# Patient Record
Sex: Female | Born: 1965 | Race: White | Hispanic: Yes | Marital: Married | Smoking: Never smoker
Health system: Southern US, Community
[De-identification: ages and names within clinical notes are randomized; demographics above are authoritative.]

---

## 2018-04-14 ENCOUNTER — Emergency Department (HOSPITAL_BASED_OUTPATIENT_CLINIC_OR_DEPARTMENT_OTHER): Payer: BLUE CROSS/BLUE SHIELD

## 2018-04-14 ENCOUNTER — Emergency Department (HOSPITAL_BASED_OUTPATIENT_CLINIC_OR_DEPARTMENT_OTHER)
Admission: EM | Admit: 2018-04-14 | Discharge: 2018-04-14 | Disposition: A | Discharge status: Home / Self Care | Payer: BLUE CROSS/BLUE SHIELD | Attending: Emergency Medicine | Admitting: Emergency Medicine

## 2018-04-14 ENCOUNTER — Emergency Department (HOSPITAL_COMMUNITY): Payer: BLUE CROSS/BLUE SHIELD

## 2018-04-14 ENCOUNTER — Other Ambulatory Visit: Payer: Self-pay

## 2018-04-14 ENCOUNTER — Encounter (HOSPITAL_BASED_OUTPATIENT_CLINIC_OR_DEPARTMENT_OTHER): Payer: Self-pay

## 2018-04-14 DIAGNOSIS — I10 Essential (primary) hypertension: Secondary | ICD-10-CM | POA: Diagnosis not present

## 2018-04-14 DIAGNOSIS — R200 Anesthesia of skin: Secondary | ICD-10-CM | POA: Insufficient documentation

## 2018-04-14 LAB — COMPREHENSIVE METABOLIC PANEL
ALK PHOS: 72 U/L (ref 38–126)
ALT: 27 U/L (ref 14–54)
AST: 28 U/L (ref 15–41)
Albumin: 4.3 g/dL (ref 3.5–5.0)
Anion gap: 8 (ref 5–15)
BUN: 14 mg/dL (ref 6–20)
CHLORIDE: 105 mmol/L (ref 101–111)
CO2: 23 mmol/L (ref 22–32)
CREATININE: 0.55 mg/dL (ref 0.44–1.00)
Calcium: 8.7 mg/dL — ABNORMAL LOW (ref 8.9–10.3)
Glucose, Bld: 110 mg/dL — ABNORMAL HIGH (ref 65–99)
Potassium: 3.1 mmol/L — ABNORMAL LOW (ref 3.5–5.1)
SODIUM: 136 mmol/L (ref 135–145)
Total Bilirubin: 0.3 mg/dL (ref 0.3–1.2)
Total Protein: 8.1 g/dL (ref 6.5–8.1)

## 2018-04-14 LAB — RAPID URINE DRUG SCREEN, HOSP PERFORMED
Amphetamines: NOT DETECTED
Barbiturates: NOT DETECTED
Benzodiazepines: NOT DETECTED
Cocaine: NOT DETECTED
Opiates: NOT DETECTED
Tetrahydrocannabinol: NOT DETECTED

## 2018-04-14 LAB — PREGNANCY, URINE: Preg Test, Ur: NEGATIVE

## 2018-04-14 LAB — CBC
HEMATOCRIT: 38.3 % (ref 36.0–46.0)
HEMOGLOBIN: 13.1 g/dL (ref 12.0–15.0)
MCH: 25.7 pg — ABNORMAL LOW (ref 26.0–34.0)
MCHC: 34.2 g/dL (ref 30.0–36.0)
MCV: 75.2 fL — AB (ref 78.0–100.0)
Platelets: 215 10*3/uL (ref 150–400)
RBC: 5.09 MIL/uL (ref 3.87–5.11)
RDW: 19.5 % — ABNORMAL HIGH (ref 11.5–15.5)
WBC: 6.9 10*3/uL (ref 4.0–10.5)

## 2018-04-14 LAB — PROTIME-INR
INR: 0.9
Prothrombin Time: 12 s (ref 11.4–15.2)

## 2018-04-14 LAB — CBG MONITORING, ED: Glucose-Capillary: 115 mg/dL — ABNORMAL HIGH (ref 65–99)

## 2018-04-14 LAB — TROPONIN I: Troponin I: <0.03 ng/mL

## 2018-04-14 LAB — DIFFERENTIAL
Basophils Absolute: 0 K/uL (ref 0.0–0.1)
Basophils Relative: 0 %
Eosinophils Absolute: 0.3 K/uL (ref 0.0–0.7)
Eosinophils Relative: 4 %
Lymphocytes Relative: 29 %
Lymphs Abs: 2 K/uL (ref 0.7–4.0)
Monocytes Absolute: 0.6 K/uL (ref 0.1–1.0)
Monocytes Relative: 8 %
Neutro Abs: 4 K/uL (ref 1.7–7.7)
Neutrophils Relative %: 59 %

## 2018-04-14 LAB — URINALYSIS, ROUTINE W REFLEX MICROSCOPIC
Bilirubin Urine: NEGATIVE
Glucose, UA: NEGATIVE mg/dL
Ketones, ur: NEGATIVE mg/dL
Leukocytes, UA: NEGATIVE
Nitrite: NEGATIVE
Protein, ur: NEGATIVE mg/dL
Specific Gravity, Urine: 1.015 (ref 1.005–1.030)
pH: 7.5 (ref 5.0–8.0)

## 2018-04-14 LAB — URINALYSIS, MICROSCOPIC (REFLEX)

## 2018-04-14 LAB — APTT: aPTT: 28 s (ref 24–36)

## 2018-04-14 LAB — ETHANOL: Alcohol, Ethyl (B): <10 mg/dL (ref <10)

## 2018-04-14 MED ORDER — OXYCODONE HCL 5 MG PO TABS: 5.0000 mg | ORAL_TABLET | Freq: Once | ORAL | Status: DC

## 2018-04-14 MED ORDER — ACETAMINOPHEN 500 MG PO TABS
1000.0000 mg | ORAL_TABLET | Freq: Once | ORAL | Status: AC
  Administered 2018-04-14: 1000 mg via ORAL
  Filled 2018-04-14: qty 2

## 2018-04-14 NOTE — ED Notes (Signed)
Pt verbalizes understanding of d/c instructions. Pt ambulatory at d/c with all belongings.   

## 2018-04-14 NOTE — Discharge Instructions (Addendum)
Your MRI today did not show evidence of stroke, tumor, or bleed.  The MRI was reassuring.  Based on the discussion with the previous team, the neurology team requested you follow-up as an outpatient with neurology and return to the emergency department if any symptoms change or worsen, as her symptoms have resolved, we feel you are safe for discharge home.  Please follow-up as directed.

## 2018-04-14 NOTE — ED Notes (Signed)
Pt reports pain in right arm around elbow x 3 days. Yesterday her right hand went numb and she states it is still numb. Today she had numbness in her chin and around her mouth which has resolved. She denies Hx of HTN but states her bp was high last year when she was [facility]. She is not on any medications. Speech is clear, facial symmetry present, cao x 4.

## 2018-04-14 NOTE — ED Provider Notes (Signed)
[facility] EMERGENCY DEPARTMENT Provider Note   CSN: [id number] Arrival date & time: 04/14/18  1625     History   Chief Complaint Chief Complaint  Patient presents with  . Numbness    HPI Maureen Drake is a 52 y.o. female.  52 yo F with a cc of R hand numbness.  Going on for past 28 hours or so.  This happened about noon time yesterday.  The patient felt that her right hand was heavy.  She was having trouble describing it other than that.  She does feel that it hurts her as well.  She also feels the same sensation to bilateral aspects along the lower jawline.  She denies headache or neck pain.  Denies trouble swallowing or with speech.  Denies weakness.  Has been ambulating without difficulty.  Denies trauma.  Denies issue like this before.  The history is provided by the patient and the spouse.  Illness  This is a new problem. The current episode started yesterday. The problem occurs constantly. The problem has not changed since onset.Pertinent negatives include no chest pain, no headaches and no shortness of breath. Nothing aggravates the symptoms. Nothing relieves the symptoms. She has tried nothing for the symptoms. The treatment provided no relief.    History reviewed. No pertinent past medical history.  There are no active problems to display for this patient.   History reviewed. No pertinent surgical history.   OB History   None      Home Medications    Prior to Admission medications   Not on File    Family History History reviewed. No pertinent family history.  Social History Social History   Tobacco Use  . Smoking status: Never Smoker  . Smokeless tobacco: Never Used  Substance Use Topics  . Alcohol use: Never    Frequency: Never  . Drug use: Never     Allergies   Patient has no allergy information on record.   Review of Systems Review of Systems  Constitutional: Negative for chills and fever.  HENT: Negative for congestion and  rhinorrhea.   Eyes: Negative for redness and visual disturbance.  Respiratory: Negative for shortness of breath and wheezing.   Cardiovascular: Negative for chest pain and palpitations.  Gastrointestinal: Negative for nausea and vomiting.  Genitourinary: Negative for dysuria and urgency.  Musculoskeletal: Negative for arthralgias and myalgias.  Skin: Negative for pallor and wound.  Neurological: Positive for numbness. Negative for dizziness and headaches.     Physical Exam Updated Vital Signs BP (!) 186/102   Pulse 70   Temp 98.7 F (37.1 C)   Resp 19   Ht 5\' 6"  (1.676 m)   Wt 77.1 kg (170 lb)   SpO2 96%   BMI 27.44 kg/m   Physical Exam  Constitutional: She is oriented to person, place, and time. She appears well-developed and well-nourished. No distress.  HENT:  Head: Normocephalic and atraumatic.  Eyes: Pupils are equal, round, and reactive to light. EOM are normal.  Neck: Normal range of motion. Neck supple.  Cardiovascular: Normal rate and regular rhythm. Exam reveals no gallop and no friction rub.  No murmur heard. Pulmonary/Chest: Effort normal. She has no wheezes. She has no rales.  Abdominal: Soft. She exhibits no distension and no mass. There is no tenderness. There is no guarding.  Musculoskeletal: She exhibits no edema or tenderness.  No weakness or numbness to the right hand.  Benign neuro exam.  Neurological: She is alert and oriented  to person, place, and time. She has normal strength. No cranial nerve deficit or sensory deficit. She displays a negative Romberg sign. Coordination and gait normal. GCS eye subscore is 4. GCS verbal subscore is 5. GCS motor subscore is 6.  Ambulates without difficulty.  Negative Spurling's  Skin: Skin is warm and dry. She is not diaphoretic.  Psychiatric: She has a normal mood and affect. Her behavior is normal.  Nursing note and vitals reviewed.    ED Treatments / Results  Labs (all labs ordered are listed, but only abnormal  results are displayed) Labs Reviewed  CBC - Abnormal; Notable for the following components:      Result Value   MCV 75.2 (*)    MCH 25.7 (*)    RDW 19.5 (*)    All other components within normal limits  URINALYSIS, ROUTINE W REFLEX MICROSCOPIC - Abnormal; Notable for the following components:   Hgb urine dipstick TRACE (*)    All other components within normal limits  URINALYSIS, MICROSCOPIC (REFLEX) - Abnormal; Notable for the following components:   Bacteria, UA RARE (*)    Squamous Epithelial / LPF 0-5 (*)    All other components within normal limits  CBG MONITORING, ED - Abnormal; Notable for the following components:   Glucose-Capillary 115 (*)    All other components within normal limits  PROTIME-INR  APTT  DIFFERENTIAL  RAPID URINE DRUG SCREEN, HOSP PERFORMED  PREGNANCY, URINE  ETHANOL  COMPREHENSIVE METABOLIC PANEL  TROPONIN I    EKG EKG Interpretation  Date/Time:  Sunday April 14 2018 16:34:47 EDT Ventricular Rate:  77 PR Interval:  156 QRS Duration: 100 QT Interval:  412 QTC Calculation: 466 R Axis:   15 Text Interpretation:  Normal sinus rhythm Possible Left atrial enlargement Incomplete right bundle branch block Borderline ECG No old tracing to compare Confirmed by Tameka Hoiland ([id number]) on 04/14/2018 4:54:58 PM   Radiology Ct Head Wo Contrast  Result Date: 04/14/2018 CLINICAL DATA:  Right arm pain, right arm numbness. EXAM: CT HEAD WITHOUT CONTRAST TECHNIQUE: Contiguous axial images were obtained from the base of the skull through the vertex without intravenous contrast. COMPARISON:  None. FINDINGS: Brain: No acute intracranial abnormality. Specifically, no hemorrhage, hydrocephalus, mass lesion, acute infarction, or significant intracranial injury. Vascular: No hyperdense vessel or unexpected calcification. Skull: No acute calvarial abnormality. Sinuses/Orbits: Visualized paranasal sinuses and mastoids clear. Orbital soft tissues unremarkable. Other: None  IMPRESSION: Normal study. Electronically Signed   By: Kevin  Dover M.D.   On: 04/14/2018 17:32    Procedures Procedures (including critical care time)  Medications Ordered in ED Medications  acetaminophen (TYLENOL) tablet 1,000 mg (has no administration in time range)  oxyCODONE (Oxy IR/ROXICODONE) immediate release tablet 5 mg (has no administration in time range)     Initial Impression / Assessment and Plan / ED Course  I have reviewed the triage vital signs and the nursing notes.  Pertinent labs & imaging results that were available during my care of the patient were reviewed by me and considered in my medical decision making (see chart for details).     52  yo F with a chief complaint of right hand numbness.  This is a bit atypical from a normal strokelike scenario.  She is markedly hypertensive though has not seen a doctor as an outpatient in her life.  Neuro exam is benign.  I discussed the case with the neurologist on-call, Dr. Jerrell Belfast he recommended an MRI without contrast.  If this is normal  then he recommends an outpatient neurology follow-up.  I discussed the case with Dr. Rush Landmarkegeler who accepted the patient in transfer.  The patients results and plan were reviewed and discussed.   Any x-rays performed were independently reviewed by myself.   Differential diagnosis were considered with the presenting HPI.  Medications  acetaminophen (TYLENOL) tablet 1,000 mg (has no administration in time range)  oxyCODONE (Oxy IR/ROXICODONE) immediate release tablet 5 mg (has no administration in time range)    Vitals:   04/14/18 1639 04/14/18 1640 04/14/18 1739 04/14/18 1756  BP: (!) 207/96  (!) 186/102   Pulse:  71 70   Resp:  14 19   Temp:    98.7 F (37.1 C)  TempSrc:      SpO2: 99% 99% 96%   Weight:      Height:        Final diagnoses:  Numbness      Final Clinical Impressions(s) / ED Diagnoses   Final diagnoses:  Numbness    ED Discharge Orders        Ordered     Ambulatory referral to Neurology    Comments:  Hand numbness   04/14/18 1801       Melene PlanFloyd, Derren Suydam, DO 04/14/18 1805

## 2018-04-14 NOTE — ED Provider Notes (Signed)
Patient transferred [facility] from Dr. Adela LankFloyd.  Next  [facility] was [facility] order to get an MRI to rule out stroke as the imaging was not available at that facility.  According to Dr. Lanetta InchFloyd's report, patient had right arm numbness as well as some chin numbness.  Patient will get MRIs.  If MRIs show a normality's, anticipate patient will require neurology consultation and evaluation.  If MRI is negative, anticipate patient will be stable for discharge home.  10:26 PM MRI was negative.   Patient was reassessed and her symptoms  continued to have resolved.  Patient will follow-up with outpatient neurology and observe strict return precautions.  Patient and family had no other questions or concerns and patient was discharged in good condition after reassuring MRI.     Clinical Impression: 1. Numbness     Disposition: Discharge  Condition: Good  I have discussed the results, Dx and Tx plan with the pt(& family if present). He/she/they expressed understanding and agree(s) with the plan. Discharge instructions discussed at great length. Strict return precautions discussed and pt &/or family have verbalized understanding of the instructions. No further questions at time of discharge.    New Prescriptions   No medications on file    [facility] Up: [facility] [address] [place] [phone] Go to    [facility] [address]     [address] [place] [phone]    [facility] [address] [id number] [place]c [place] [id number] [phone]        Tegeler, Canary Brimhristopher J, MD 04/14/18 2226

## 2018-04-14 NOTE — ED Triage Notes (Signed)
Patient began to have right arm/hand numbness yesterday and has continued to occur intermittently. Today she also began to have numbness in her chin/mouth/jawline. Denies pain or weakness currently.

## 2018-04-14 NOTE — ED Triage Notes (Signed)
Pt presents with numbness to right arm onset yesterday. Now presents with numbness to chin that started 1530.

## 2018-04-14 NOTE — ED Notes (Signed)
Patient transported to MRI 

## 2018-04-14 NOTE — ED Notes (Signed)
Patient transported to CT 

## 2018-04-14 NOTE — ED Notes (Signed)
Alert, NAD, calm, interactive, resps e/u, speaking in clear complete sentences, no dyspnea noted, skin W&D, VSS, BP remains elevated, "feel better", reports some improved yet residual numbness in R 4-5th finger tips, (denies: pain, sob, nausea, dizziness or visual changes). [facility] Pending [facility] arrival, ~15 minutes.

## 2019-06-23 IMAGING — CT CT HEAD W/O CM
3 series · 16 of 47 positions shown, 19 images · non-contrast
Comparison: None.

CLINICAL DATA: Right arm pain, right arm numbness.

EXAM:
CT HEAD WITHOUT CONTRAST
TECHNIQUE: Contiguous axial images were obtained from the base of the skull
through the vertex without intravenous contrast.

[Series 2: head wo · axial · 0.45mm/px · z∈[+940,+1070]mm · 10 of 32 slices shown, 13 images]
[im 3/32  brain]
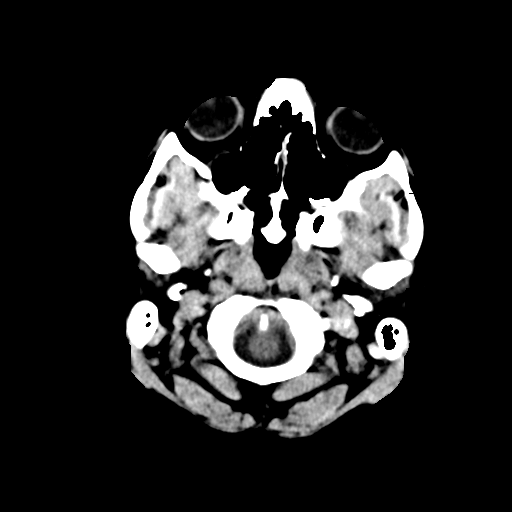
[im 3/32  bone]
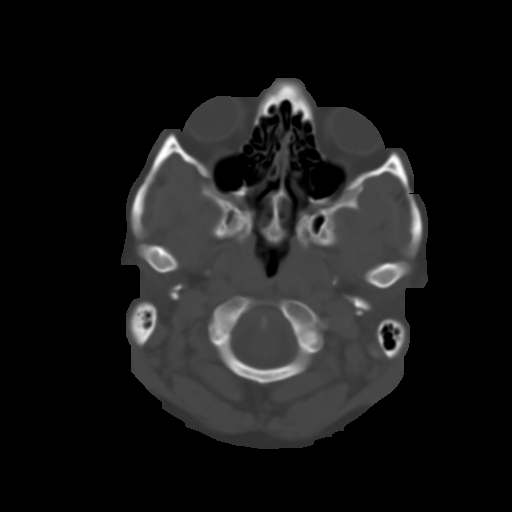
[im 6/32  brain]
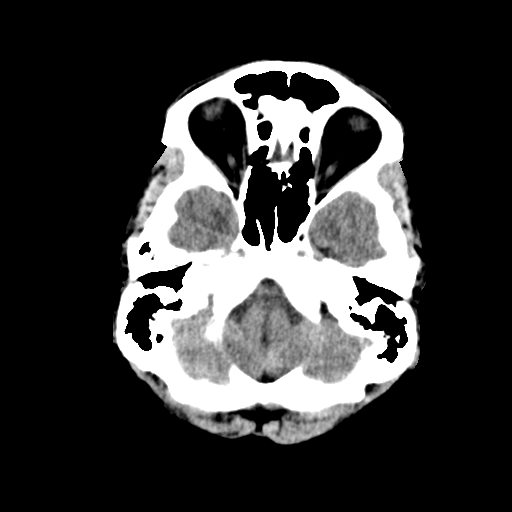
[im 9/32  brain]
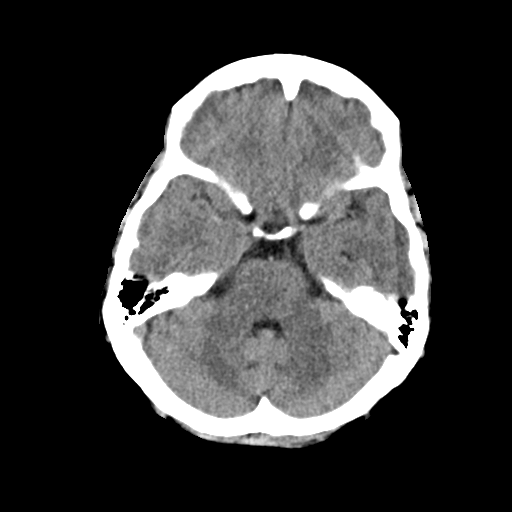
[im 11/32  brain]
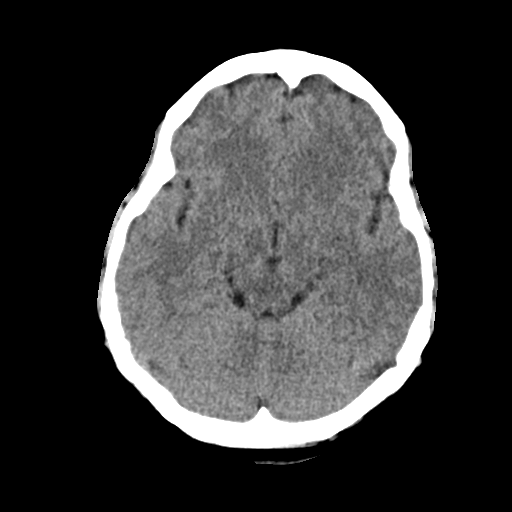
[im 14/32  brain]
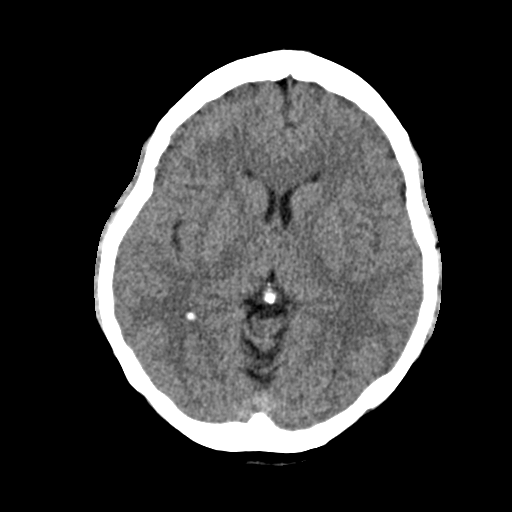
[im 14/32  bone]
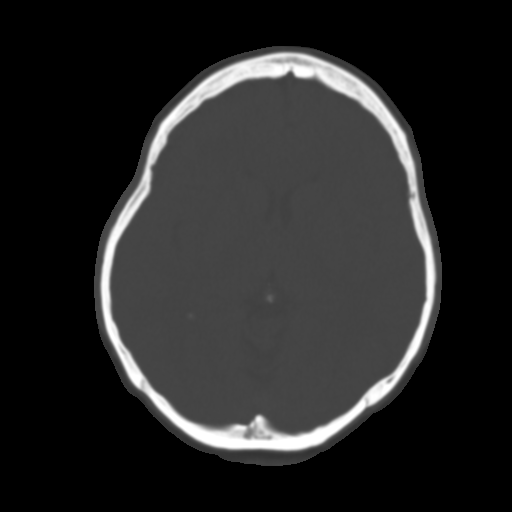
[im 18/32  brain]
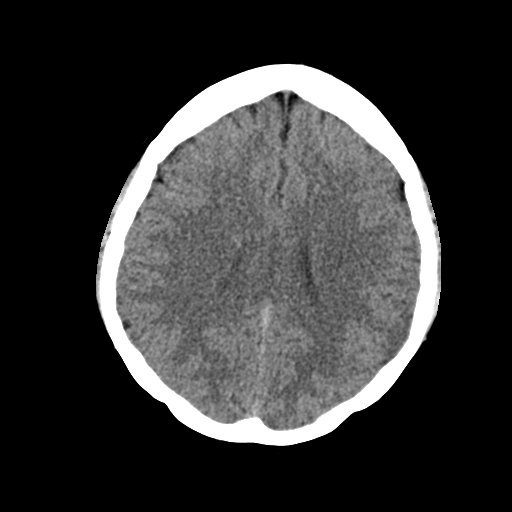
[im 21/32  brain]
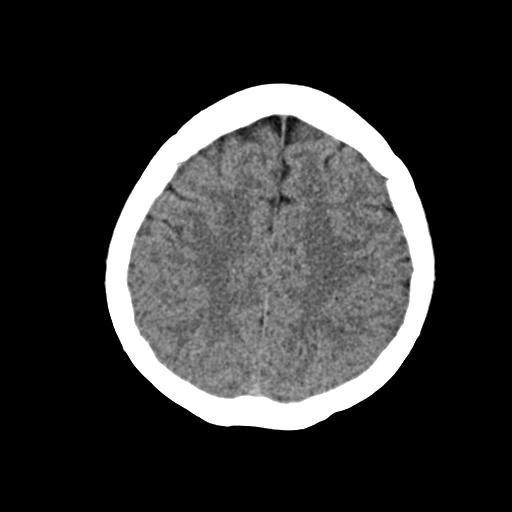
[im 24/32  brain]
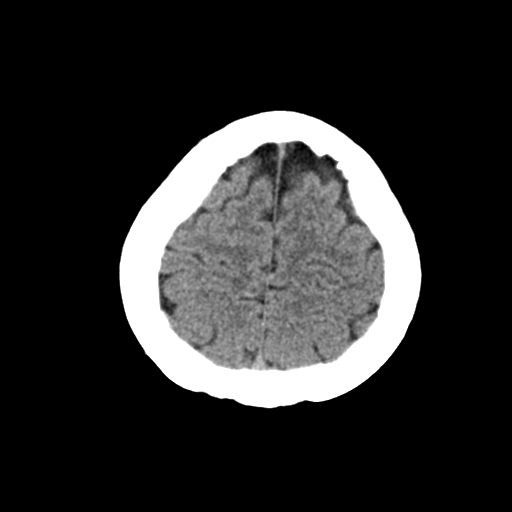
[im 26/32  brain]
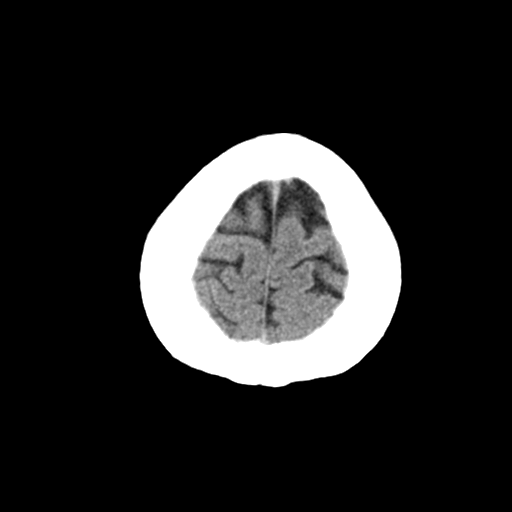
[im 26/32  bone]
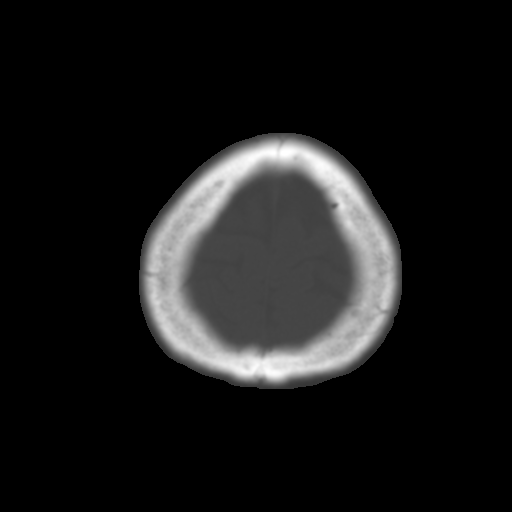
[im 29/32  brain]
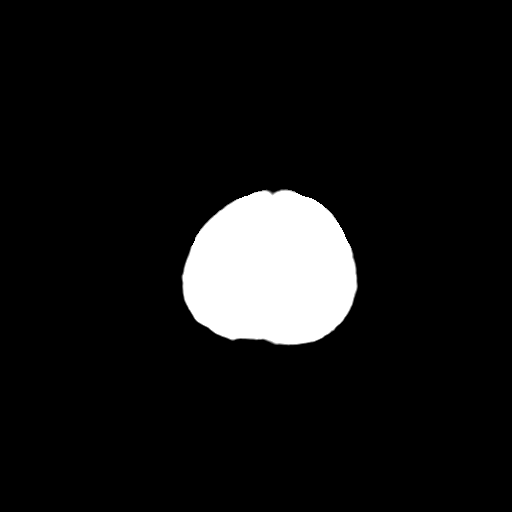

[Series 4: coronal soft · coronal · 0.32mm/px · 3 of 66 slices shown]
[im 22/66  brain]
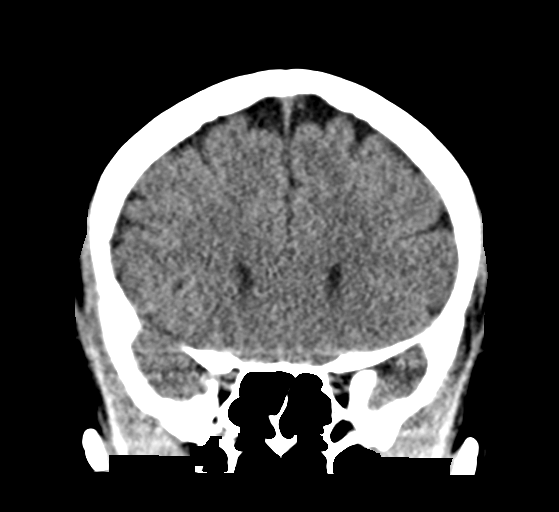
[im 29/66  brain]
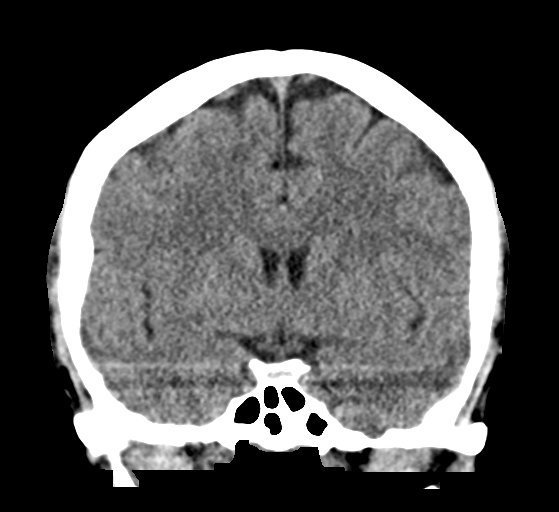
[im 37/66  brain]
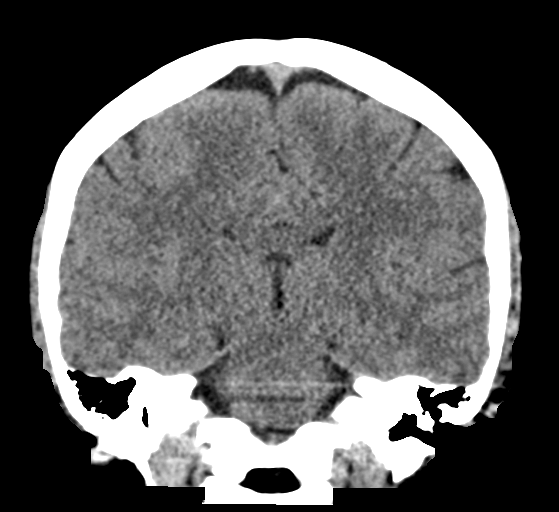

[Series 5: sag soft · sagittal · 0.34mm/px · 3 of 56 slices shown]
[im 19/56  brain]
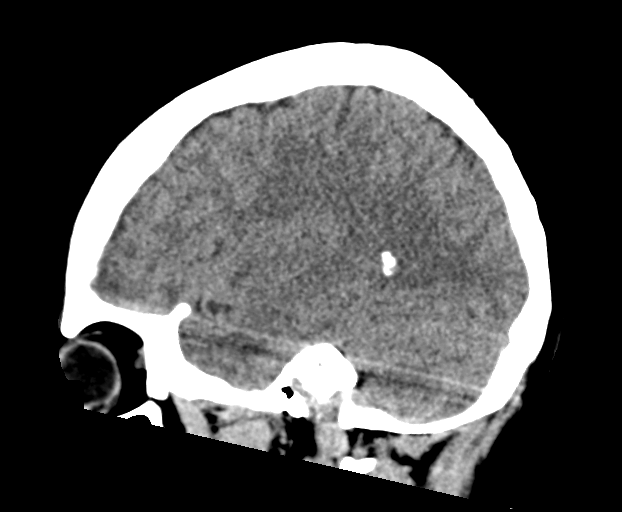
[im 28/56  brain]
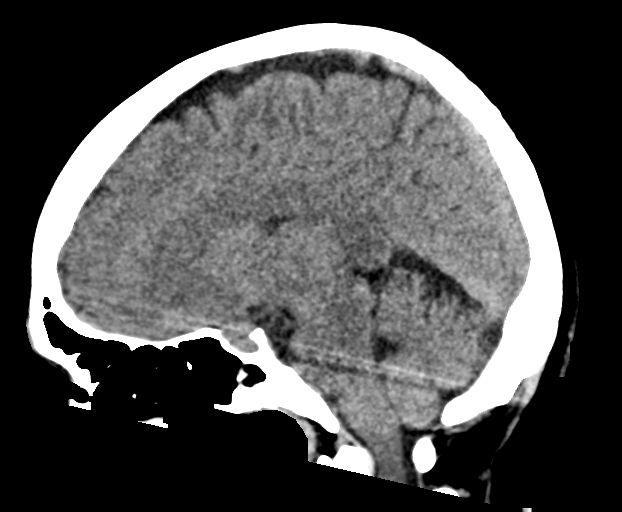
[im 37/56  brain]
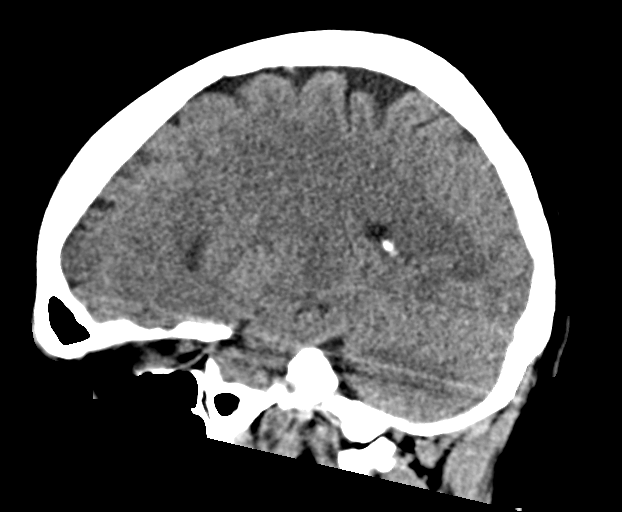

[16 of 47 positions shown; findings below may reference images not displayed]

FINDINGS: Brain: No acute intracranial abnormality. Specifically, no
hemorrhage, hydrocephalus, mass lesion, acute infarction, or
significant intracranial injury.

Vascular: No hyperdense vessel or unexpected calcification.

Skull: No acute calvarial abnormality.

Sinuses/Orbits: Visualized paranasal sinuses and mastoids clear.
Orbital soft tissues unremarkable.

Other: None
IMPRESSION: Normal study.

## 2021-01-13 ENCOUNTER — Other Ambulatory Visit: Payer: Self-pay

## 2021-01-13 DIAGNOSIS — Z20822 Contact with and (suspected) exposure to covid-19: Secondary | ICD-10-CM

## 2021-01-14 LAB — SARS-COV-2, NAA 2 DAY TAT

## 2021-01-14 LAB — NOVEL CORONAVIRUS, NAA: SARS-CoV-2, NAA: NOT DETECTED

## 2021-12-23 ENCOUNTER — Ambulatory Visit (INDEPENDENT_AMBULATORY_CARE_PROVIDER_SITE_OTHER): Payer: Self-pay | Admitting: Nurse Practitioner

## 2021-12-23 ENCOUNTER — Other Ambulatory Visit: Payer: Self-pay

## 2021-12-23 ENCOUNTER — Encounter (HOSPITAL_BASED_OUTPATIENT_CLINIC_OR_DEPARTMENT_OTHER): Payer: Self-pay | Admitting: Nurse Practitioner

## 2021-12-23 VITALS — BP 117/72 | HR 63 | Ht 65.0 in | Wt 174.4 lb

## 2021-12-23 DIAGNOSIS — G8929 Other chronic pain: Secondary | ICD-10-CM

## 2021-12-23 DIAGNOSIS — N909 Noninflammatory disorder of vulva and perineum, unspecified: Secondary | ICD-10-CM | POA: Insufficient documentation

## 2021-12-23 DIAGNOSIS — M25572 Pain in left ankle and joints of left foot: Secondary | ICD-10-CM

## 2021-12-23 DIAGNOSIS — L292 Pruritus vulvae: Secondary | ICD-10-CM

## 2021-12-23 DIAGNOSIS — Z23 Encounter for immunization: Secondary | ICD-10-CM

## 2021-12-23 HISTORY — DX: Noninflammatory disorder of vulva and perineum, unspecified: N90.9

## 2021-12-23 HISTORY — DX: Other chronic pain: G89.29

## 2021-12-23 HISTORY — DX: Pruritus vulvae: L29.2

## 2021-12-23 MED ORDER — BETAMETHASONE DIPROPIONATE 0.05 % EX OINT: TOPICAL_OINTMENT | Freq: Two times a day (BID) | CUTANEOUS | 1 refills | Status: DC

## 2021-12-23 NOTE — Patient Instructions (Addendum)
Thank you for choosing [facility] for your Primary Care needs. I am excited for the opportunity to partner with you to meet your health care goals. It was a pleasure meeting you today!  Recommendations from today's visit: I would like to you to let me know if the redness and swelling come back on your vagina. It would be helpful if we can look at it while it is active to best manage this. I have sent in a cream for you to use for itching. You can put a thin layer on the area of itching twice a day to see if this helps.  I feel the pain in your ankle is likely related to arthritis. This is best managed with tylenol and stretches every day. I would like to get an x-ray of this to make sure that there is nothing else going on.  I want to have you come back so we can do your full physical and get labs, but I want to wait until we get the paperwork completed for financial assistance with the visits. Until then, you let me know if anything comes up.   Information on diet, exercise, and health maintenance recommendations are listed below. This is information to help you be sure you are on track for optimal health and monitoring.   Please look over this and let us know if you have any questions or if you have completed any of the health maintenance outside of [place] so that we can be sure your records are up to date.  ___________________________________________________________ About Me: I am an Adult-Geriatric Nurse Practitioner with a background in caring for patients for more than 20 years with a strong intensive care background. I provide primary care and sports medicine services to patients age 62 and older within this office. My education had a strong focus on caring for the older adult population, which I am passionate about. I am also the director of the APP Fellowship with [facility].   My desire is to provide you with the best service through preventive  medicine and supportive care. I consider you a part of the medical team and value your input. I work diligently to ensure that you are heard and your needs are met in a safe and effective manner. I want you to feel comfortable with me as your provider and want you to know that your health concerns are important to me.  For your information, our office hours are: Monday, Tuesday, and Thursday 8:00 AM - 5:00 PM Wednesday and Friday 8:00 AM - 12:00 PM.   In my time away from the office I am teaching new APP's within the system and am unavailable, but my partner, Dr. Burnard Bunting is in the office for emergent needs.   If you have questions or concerns, please call our office at [phone] or send [place] a MyChart message and we will respond as quickly as possible.  ____________________________________________________________ MyChart:  For all urgent or time sensitive needs we ask that you please call the office to avoid delays. Our number is [phone]. MyChart is not constantly monitored and due to the large volume of messages a day, replies may take up to 72 business hours.  MyChart Policy: MyChart allows for you to see your visit notes, after visit summary, provider recommendations, lab and tests results, make an appointment, request refills, and contact your provider or the office for non-urgent questions or concerns. Providers are seeing patients during normal business  hours and do not have built in time to review MyChart messages.  We ask that you allow a minimum of 3 business days for responses to [employer]. For this reason, please do not send urgent requests through [place]. Please call the office at [phone]. New and ongoing conditions may require a visit. We have virtual and in person visit available for your convenience.  Complex MyChart concerns may require a visit. Your provider may request you schedule a virtual or in person visit to ensure we are providing the best care  possible. MyChart messages sent after 11:00 AM on Friday will not be received by the provider until Monday morning.    Lab and Test Results: You will receive your lab and test results on MyChart as soon as they are completed and results have been sent by the lab or testing facility. Due to this service, you will receive your results BEFORE your provider.  I review lab and tests results each morning prior to seeing patients. Some results require collaboration with other providers to ensure you are receiving the most appropriate care. For this reason, we ask that you please allow a minimum of 3-5 business days from the time the ALL results have been received for your provider to receive and review lab and test results and contact you about these.  Most lab and test result comments from the provider will be sent through [place]. Your provider may recommend changes to the plan of care, follow-up visits, repeat testing, ask questions, or request an office visit to discuss these results. You may reply directly to this message or call the office at [phone] to provide information for the provider or set up an appointment. In some instances, you will be called with test results and recommendations. Please let us know if this is preferred and we will make note of this in your chart to provide this for you.    If you have not heard a response to your lab or test results in 5 business days from all results returning to [place], please call the office to let us know. We ask that you please avoid calling prior to this time unless there is an emergent concern. Due to high call volumes, this can delay the resulting process.  After Hours: For all non-emergency after hours needs, please call the office at [phone] and select the option to reach the on-call provider service. On-call services are shared between multiple [place] offices and therefore it will not be possible to speak directly with your provider.  On-call providers may provide medical advice and recommendations, but are unable to provide refills for maintenance medications.  For all emergency or urgent medical needs after normal business hours, we recommend that you seek care at the closest Urgent Care or Emergency Department to ensure appropriate treatment in a timely manner.  [facility] [place] has a 24 hour emergency room located on the ground floor for your convenience.   Urgent Concerns During the Business Day Providers are seeing patients from 8AM to [place] with a busy schedule and are most often not able to respond to non-urgent calls until the end of the day or the next business day. If you should have URGENT concerns during the day, please call and speak to the nurse or schedule a same day appointment so that we can address your concern without delay.   Thank you, again, for choosing me as your health care partner. I appreciate your trust and look forward  to learning more about you.   Maureen Keeler, DNP, AGNP-c ___________________________________________________________  Health Maintenance Recommendations Screening Testing Mammogram Every 1 -2 years based on history and risk factors Starting at age 33 Pap Smear Ages 21-39 every 3 years Ages 37-65 every 5 years with HPV testing More frequent testing may be required based on results and history Colon Cancer Screening Every 1-10 years based on test performed, risk factors, and history Starting at age 88 Bone Density Screening Every 2-10 years based on history Starting at age 50 for women Recommendations for men differ based on medication usage, history, and risk factors AAA Screening One time ultrasound Men 66-69 years old who have every smoked Lung Cancer Screening Low Dose Lung CT every 12 months Age 37-80 years with a 30 pack-year smoking history who still smoke or who have quit within the last 15 years  Screening Labs Routine  Labs: Complete Blood  Count (CBC), Complete Metabolic Panel (CMP), Cholesterol (Lipid Panel) Every 6-12 months based on history and medications May be recommended more frequently based on current conditions or previous results Hemoglobin A1c Lab Every 3-12 months based on history and previous results Starting at age 21 or earlier with diagnosis of diabetes, high cholesterol, BMI >26, and/or risk factors Frequent monitoring for patients with diabetes to ensure blood sugar control Thyroid Panel (TSH w/ T3 & T4) Every 6 months based on history, symptoms, and risk factors May be repeated more often if on medication HIV One time testing for all patients 62 and older May be repeated more frequently for patients with increased risk factors or exposure Hepatitis C One time testing for all patients 22 and older May be repeated more frequently for patients with increased risk factors or exposure Gonorrhea, Chlamydia Every 12 months for all sexually active persons 13-24 years Additional monitoring may be recommended for those who are considered high risk or who have symptoms PSA Men 28-81 years old with risk factors Additional screening may be recommended from age 64-69 based on risk factors, symptoms, and history  Vaccine Recommendations Tetanus Booster All adults every 10 years Flu Vaccine All patients 6 months and older every year COVID Vaccine All patients 12 years and older Initial dosing with booster May recommend additional booster based on age and health history HPV Vaccine 2 doses all patients age 19-26 Dosing may be considered for patients over 26 Shingles Vaccine (Shingrix) 2 doses all adults 87 years and older Pneumonia (Pneumovax 23) All adults 62 years and older May recommend earlier dosing based on health history Pneumonia (Prevnar 52) All adults 42 years and older Dosed 1 year after Pneumovax 23  Additional Screening, Testing, and Vaccinations may be recommended on an individualized basis  based on family history, health history, risk factors, and/or exposure.  __________________________________________________________  Diet Recommendations for All Patients  I recommend that all patients maintain a diet low in saturated fats, carbohydrates, and cholesterol. While this can be challenging at first, it is not impossible and small changes can make big differences.  Things to try: Decreasing the amount of soda, sweet tea, and/or juice to one or less per day and replace with water While water is always the first choice, if you do not like water you may consider adding a water additive without sugar to improve the taste other sugar free drinks Replace potatoes with a brightly colored vegetable at dinner Use healthy oils, such as canola oil or olive oil, instead of butter or hard margarine Limit your bread intake to two  pieces or less a day Replace regular pasta with low carb pasta options Bake, broil, or grill foods instead of frying Monitor portion sizes  Eat smaller, more frequent meals throughout the day instead of large meals  An important thing to remember is, if you love foods that are not great for your health, you don't have to give them up completely. Instead, allow these foods to be a reward when you have done well. Allowing yourself to still have special treats every once in a while is a nice way to tell yourself thank you for working hard to keep yourself healthy.   Also remember that every day is a new day. If you have a bad day and "fall off the wagon", you can still climb right back up and keep moving along on your journey!  We have resources available to help you!  Some websites that may be helpful include: www.http://carter.biz/  Www.VeryWellFit.com _____________________________________________________________  Activity Recommendations for All Patients  I recommend that all adults get at least 20 minutes of moderate physical activity that elevates your heart rate at  least 5 days out of the week.  Some examples include: Walking or jogging at a pace that allows you to carry on a conversation Cycling (stationary bike or outdoors) Water aerobics Yoga Weight lifting Dancing If physical limitations prevent you from putting stress on your joints, exercise in a pool or seated in a chair are excellent options.  Do determine your MAXIMUM heart rate for activity: YOUR AGE - 220 = MAX HeartRate   Remember! Do not push yourself too hard.  Start slowly and build up your pace, speed, weight, time in exercise, etc.  Allow your body to rest between exercise and get good sleep. You will need more water than normal when you are exerting yourself. Do not wait until you are thirsty to drink. Drink with a purpose of getting in at least 8, 8 ounce glasses of water a day plus more depending on how much you exercise and sweat.    If you begin to develop dizziness, chest pain, abdominal pain, jaw pain, shortness of breath, headache, vision changes, lightheadedness, or other concerning symptoms, stop the activity and allow your body to rest. If your symptoms are severe, seek emergency evaluation immediately. If your symptoms are concerning, but not severe, please let us know so that we can recommend further evaluation.

## 2021-12-23 NOTE — Progress Notes (Signed)
Tollie Eth, DNP, AGNP-c Primary Care & Sports Medicine [address]   [address] [place], [place] [address] [phone]  New patient visit   Patient: Maureen Drake   DOB: October 07, 1966   55 y.o. Female  MRN: [id number] Visit Date: 12/23/2021  Patient Care Team: Dontrae Morini, Sung Amabile, NP as PCP - General (Nurse Practitioner)  Today's healthcare provider: Tollie Eth, NP   Chief Complaint  Patient presents with   New Patient (Initial Visit)    Patient presents today to establish care, would like a complete lab panel. Concerned about right foot pain and swelling and heel pain.   Subjective    HPI  Maureen Drake is a 55 y.o. female who presents today as a new patient to establish care. She is present with her husband, Lelis, today.    Addilee is a married female and lives at home with her husband. She is a Futures trader. Together they have adult children. She endorses feeling safe in the home and in her environment. She is originally from [place].  She has never drank alcohol, used nicotine products, or recreational drugs.  She endorses a fairly sedentary lifestyle and a diet high in latino inspired foods.  She has a medical history positive for hypertension for which she takes amlodipine and metoprolol. She is also taking ASA 81mg /d. 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   She presents today with concerns with vulvar pruritis and drainage as well as heel and ankle pain.   Vulvar Pruritis She reports a long history of irritation to one location on vulva near the vaginal introitus. She endorses a "pimple" will appear in the area and she and her husband will manually drain the area when it becomes "red, purple, and swollen". They report once the area is drained the redness dissipates and the swelling resolves. They tell me the problem reoccurs in the same area repeatedly and has been ongoing for several years. She has never had this evaluated. She has no STI  history or increased RF for STI. She is sexually active with her spouse only. She is post menopausal and has no vaginal bleeding.  She denies vesicular lesions, shallow wounds, or vaginal discharge. No fever, chills, body aches, urinary symptoms, or GI symptoms.  The "pimple" is not present at this time.  She has not had a pap smear in many years, if ever.   Heel and Ankle Pain She endorses pain in the left ankle to the anterio-lateral portion of the ankle joint. The pain is worse with walking and at times limits how much she can walk.  She tells me the pain has been present for approximately 1 year.  She has not been seen for this in the past.  She has not been treated for this in the past.  She has not done anything for this.  She has no known injuries. She also endorses pain in the left heel directly over the dorsal surface of the calcaneus.  She endorses tenderness with heel strike and deep palpation.  She has not noticed any color changing to the heel or ankle, no ecchymosis, no edema.   History reviewed. No pertinent past medical history. History reviewed. No pertinent surgical history. No family status information on file.   History reviewed. No pertinent family history. Social History   Socioeconomic History   Marital status: Married    Spouse name: Not on file   Number of children: Not on file  Years of education: Not on file   Highest education level: Not on file  Occupational History   Not on file  Tobacco Use   Smoking status: Never   Smokeless tobacco: Never  Substance and Sexual Activity   Alcohol use: Never   Drug use: Never   Sexual activity: Not on file  Other Topics Concern   Not on file  Social History Narrative   Not on file   Social Determinants of Health   Financial Resource Strain: Not on file  Food Insecurity: Not on file  Transportation Needs: Not on file  Physical Activity: Not on file  Stress: Not on file  Social Connections: Not on file    Outpatient Medications Prior to Visit  Medication Sig   amLODipine (NORVASC) 5 MG tablet Take 1 tablet by mouth daily.   metoprolol tartrate (LOPRESSOR) 25 MG tablet Take 1 tablet by mouth 2 (two) times daily.   nitroGLYCERIN (NITROSTAT) 0.4 MG SL tablet Place under the tongue.   aspirin 81 MG EC tablet Take 1 tablet by mouth daily.   No facility-administered medications prior to visit.   Allergies  Allergen Reactions   Bee Pollen Hives   Pollen Extract     Immunization History  Administered Date(s) Administered   Influenza-Unspecified 12/23/2021    Health Maintenance  Topic Date Due   HIV Screening  Never done   Hepatitis C Screening  Never done   TETANUS/TDAP  Never done   PAP SMEAR-Modifier  Never done   COLONOSCOPY (Pts 45-70yrs Insurance coverage will need to be confirmed)  Never done   MAMMOGRAM  Never done   Zoster Vaccines- Shingrix (1 of 2) Never done   INFLUENZA VACCINE  Completed   Pneumococcal Vaccine 33-51 Years old  Aged Out   HPV VACCINES  Aged Out    Patient Care Team: Marne Meline, Sung Amabile, NP as PCP - General (Nurse Practitioner)  Review of Systems All review of systems negative except what is listed in the HPI   Objective    BP 117/72    Pulse 63    Ht 5\' 5"  (1.651 m)    Wt 174 lb 6.4 oz (79.1 kg)    SpO2 96%    BMI 29.02 kg/m  Physical Exam Vitals and nursing note reviewed.  Constitutional:      General: She is not in acute distress.    Appearance: Normal appearance.  Eyes:     Extraocular Movements: Extraocular movements intact.     Conjunctiva/sclera: Conjunctivae normal.     Pupils: Pupils are equal, round, and reactive to light.  Neck:     Vascular: No carotid bruit.  Cardiovascular:     Rate and Rhythm: Normal rate and regular rhythm.     Pulses: Normal pulses.     Heart sounds: Normal heart sounds. No murmur heard. Pulmonary:     Effort: Pulmonary effort is normal.     Breath sounds: Normal breath sounds. No wheezing.  Abdominal:      General: Bowel sounds are normal.     Palpations: Abdomen is soft.  Genitourinary:    Comments: Deferred today.  We will perform at follow-up visit. Musculoskeletal:        General: Swelling and tenderness present.     Cervical back: Normal range of motion.     Right lower leg: No edema.     Left lower leg: No edema.     Right ankle: Normal.     Left ankle: Tenderness present  over the lateral malleolus. No medial malleolus tenderness. Normal range of motion. Anterior drawer test negative. Normal pulse.     Left Achilles Tendon: Tenderness present. No defects. Thompson's test negative.     Left foot: Normal range of motion and normal capillary refill. Tenderness present. No bony tenderness or crepitus. Normal pulse.       Legs:  Skin:    General: Skin is warm and dry.     Capillary Refill: Capillary refill takes less than 2 seconds.  Neurological:     General: No focal deficit present.     Mental Status: She is alert and oriented to person, place, and time.  Psychiatric:        Mood and Affect: Mood normal.        Behavior: Behavior normal.        Thought Content: Thought content normal.        Judgment: Judgment normal.    Depression Screen PHQ 2/9 Scores 12/23/2021  PHQ - 2 Score 0  PHQ- 9 Score 3   No results found for any visits on 12/23/21.  Assessment & Plan      Problem List Items Addressed This Visit     Vulvar disorder    No examination performed today however patient will return as soon as symptoms return. Strongly suspect vulvar abscess present based on patient description. Recommend warm sitz bath and Epson salt daily and return to clinic if symptoms return. Do not try to drain or burst the area at home to prevent worsening infection.      Vulvar pruritus - Primary    Pruritus to the left labia in the setting of suspected recurrent abscess. No vaginal exam performed today however we will plan for patient to return to further evaluate for possible  causes. Low risk of possible STI infection.  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 Patient provided with low-dose betamethasone to apply intermittently for vulvar itching symptoms.  She will follow-up when she feels that the cyst begins to develop again for further evaluation. Will consider incision and drainage.  May need to perform biopsy given patient's age and postmenopausal status. Symptoms may be lessened or improved with warm sits baths in Epsom salt.  Avoid draining or popping abscess at home to prevent possible further infection.      Relevant Medications   betamethasone dipropionate (DIPROLENE) 0.05 % ointment   Chronic pain of left ankle    Left ankle pain with no decreased range of motion present today. There is a mild amount of inflammation located just anterior to the lateral malleolus consistent with possible peroneal tendinitis or subcutaneous bursitis. Strongly suspect likelihood of osteoarthritis as the causative factor. No signs of infection or erythema present today. Recommend meloxicam for inflammation and pain and 3 times daily exercise by writing the alphabet with the foot in the air. If symptoms do not improve will consider x-ray of the area for further evaluation.  At this time patient does not have insurance therefore will be as conservative as possible to avoid excess cost.      Relevant Medications   aspirin 81 MG EC tablet   Other Visit Diagnoses     Encounter for immunization            Return for future for cpe and labs and pap.      Akaya Proffit, Sung Amabile, NP, DNP, AGNP-C Primary Care & Sports Medicine at [facility]

## 2021-12-23 NOTE — Assessment & Plan Note (Signed)
Left ankle pain with no decreased range of motion present today. There is a mild amount of inflammation located just anterior to the lateral malleolus consistent with possible peroneal tendinitis or subcutaneous bursitis. Strongly suspect likelihood of osteoarthritis as the causative factor. No signs of infection or erythema present today. Recommend meloxicam for inflammation and pain and 3 times daily exercise by writing the alphabet with the foot in the air. If symptoms do not improve will consider x-ray of the area for further evaluation.  At this time patient does not have insurance therefore will be as conservative as possible to avoid excess cost.

## 2021-12-23 NOTE — Assessment & Plan Note (Signed)
No examination performed today however patient will return as soon as symptoms return. Strongly suspect vulvar abscess present based on patient description. Recommend warm sitz bath and Epson salt daily and return to clinic if symptoms return. Do not try to drain or burst the area at home to prevent worsening infection.

## 2021-12-23 NOTE — Assessment & Plan Note (Signed)
Pruritus to the left labia in the setting of suspected recurrent abscess. No vaginal exam performed today however we will plan for patient to return to further evaluate for possible causes. Low risk of possible STI infection.  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 Patient provided with low-dose betamethasone to apply intermittently for vulvar itching symptoms.  She will follow-up when she feels that the cyst begins to develop again for further evaluation. Will consider incision and drainage.  May need to perform biopsy given patient's age and postmenopausal status. Symptoms may be lessened or improved with warm sits baths in Epsom salt.  Avoid draining or popping abscess at home to prevent possible further infection.

## 2022-03-23 ENCOUNTER — Encounter (HOSPITAL_BASED_OUTPATIENT_CLINIC_OR_DEPARTMENT_OTHER): Payer: Self-pay | Admitting: Nurse Practitioner

## 2022-03-28 ENCOUNTER — Other Ambulatory Visit (HOSPITAL_COMMUNITY)
Admission: RE | Admit: 2022-03-28 | Discharge: 2022-03-28 | Disposition: A | Discharge status: Home / Self Care | Payer: 59 | Source: Ambulatory Visit | Attending: Nurse Practitioner | Admitting: Nurse Practitioner

## 2022-03-28 ENCOUNTER — Encounter (HOSPITAL_BASED_OUTPATIENT_CLINIC_OR_DEPARTMENT_OTHER): Payer: Self-pay | Admitting: Nurse Practitioner

## 2022-03-28 ENCOUNTER — Ambulatory Visit (INDEPENDENT_AMBULATORY_CARE_PROVIDER_SITE_OTHER): Payer: 59 | Admitting: Nurse Practitioner

## 2022-03-28 VITALS — BP 135/69 | HR 62 | Ht 66.0 in | Wt 179.0 lb

## 2022-03-28 DIAGNOSIS — Z0000 Encounter for general adult medical examination without abnormal findings: Secondary | ICD-10-CM | POA: Insufficient documentation

## 2022-03-28 DIAGNOSIS — Z124 Encounter for screening for malignant neoplasm of cervix: Secondary | ICD-10-CM | POA: Insufficient documentation

## 2022-03-28 HISTORY — DX: Encounter for general adult medical examination without abnormal findings: Z00.00

## 2022-03-28 NOTE — Progress Notes (Signed)
? ?BP 135/69   Pulse 62   Ht 5\' 6"  (1.676 m)   Wt 179 lb (81.2 kg)   SpO2 97%   BMI 28.89 kg/m?   ? ?Subjective:  ? ? Patient ID: Maureen Drake, female    DOB: 16-Apr-1966, 56 y.o.   MRN: [id number] ? ?HPI: ?[name] is a 56 y.o. female presenting on 03/28/2022 for comprehensive medical examination.  ? ?Current medical concerns include:none ? ?She reports regular vision exams q1-5y: no ?She reports regular dental exams q 24m: no ?Her diet consists of:  primarily home cooked meals- latino influence primarily ?She endorses exercise and/or activity of:  no routine activity ?She works in:  homemaker ? ?She denies ETOH use  ?She denies nictoine use  ?She denies illegal substance use  ? ?She reports post menopausal status with no concerns ? ?She is currently sexually active with her spouse and no additional sexual partners ?She denies concerns today about STI ? ?She denies concerns about skin changes today  ?She denies concerns about bowel changes today  ?She denies concerns about bladder changes today  ? ?Most Recent Depression Screen:  ? ?  12/23/2021  ?  4:35 PM  ?Depression screen PHQ 2/9  ?Decreased Interest 0  ?Down, Depressed, Hopeless 0  ?PHQ - 2 Score 0  ?Altered sleeping 1  ?Tired, decreased energy 1  ?Change in appetite 1  ?Feeling bad or failure about yourself  0  ?Trouble concentrating 0  ?Moving slowly or fidgety/restless 0  ?Suicidal thoughts 0  ?PHQ-9 Score 3  ?Difficult doing work/chores Not difficult at all  ? ?Most Recent Anxiety Screen:  ? ?  12/23/2021  ?  4:35 PM  ?GAD 7 : Generalized Anxiety Score  ?Nervous, Anxious, on Edge 0  ?Control/stop worrying 1  ?Worry too much - different things 1  ?Trouble relaxing 0  ?Restless 0  ?Easily annoyed or irritable 0  ?Afraid - awful might happen 0  ?Total GAD 7 Score 2  ?Anxiety Difficulty Not difficult at all  ? ?Most Recent Fall Screen: ? ?  12/23/2021  ?  4:35 PM  ?Fall Risk   ?Falls in the past year? 0  ?Number falls in past yr: 0  ?Injury with Fall? 0   ?Risk for fall due to : No Fall Risks  ?Follow up Falls evaluation completed  ? ? ?All ROS negative except what is listed above and in the HPI.  ? ?Past medical history, surgical history, medications, allergies, family history and social history reviewed with patient today and changes made to appropriate areas of the chart.  ?Past Medical History:  ?History reviewed. No pertinent past medical history. ?Medications:  ?Current Outpatient Medications on File Prior to Visit  ?Medication Sig  ? amLODipine (NORVASC) 5 MG tablet Take 1 tablet by mouth daily.  ? aspirin 81 MG EC tablet Take 1 tablet by mouth daily.  ? betamethasone dipropionate (DIPROLENE) 0.05 % ointment Apply topically 2 (two) times daily. To area of itching. Use for 3 days then stop for 1 day and repeat.  ? metoprolol tartrate (LOPRESSOR) 25 MG tablet Take 1 tablet by mouth 2 (two) times daily.  ? nitroGLYCERIN (NITROSTAT) 0.4 MG SL tablet Place under the tongue.  ? ?No current facility-administered medications on file prior to visit.  ? ?Surgical History:  ?History reviewed. No pertinent surgical history. ?Allergies:  ?Allergies  ?Allergen Reactions  ? Bee Pollen Hives  ? Pollen Extract   ? ?Social History:  ?Social History  ? ?  Socioeconomic History  ? Marital status: Married  ?  Spouse name: Not on file  ? Number of children: Not on file  ? Years of education: Not on file  ? Highest education level: Not on file  ?Occupational History  ? Not on file  ?Tobacco Use  ? Smoking status: Never  ? Smokeless tobacco: Never  ?Substance and Sexual Activity  ? Alcohol use: Never  ? Drug use: Never  ? Sexual activity: Not on file  ?Other Topics Concern  ? Not on file  ?Social History Narrative  ? Not on file  ? ?Social Determinants of Health  ? ?Financial Resource Strain: Not on file  ?Food Insecurity: Not on file  ?Transportation Needs: Not on file  ?Physical Activity: Not on file  ?Stress: Not on file  ?Social Connections: Not on file  ?Intimate Partner  Violence: Not on file  ? ?Social History  ? ?Tobacco Use  ?Smoking Status Never  ?Smokeless Tobacco Never  ? ?Social History  ? ?Substance and Sexual Activity  ?Alcohol Use Never  ? ?Family History:  ?History reviewed. No pertinent family history. ? ?   ?Objective:  ?  ?BP 135/69   Pulse 62   Ht 5\' 6"  (1.676 m)   Wt 179 lb (81.2 kg)   SpO2 97%   BMI 28.89 kg/m?   ?Wt Readings from Last 3 Encounters:  ?03/28/22 179 lb (81.2 kg)  ?12/23/21 174 lb 6.4 oz (79.1 kg)  ?04/14/18 170 lb (77.1 kg)  ?  ?Physical Exam ?Vitals and nursing note reviewed. Exam conducted with a chaperone present.  ?Constitutional:   ?   General: She is not in acute distress. ?   Appearance: Normal appearance.  ?HENT:  ?   Head: Normocephalic and atraumatic.  ?   Right Ear: Hearing, tympanic membrane, ear canal and external ear normal.  ?   Left Ear: Hearing, tympanic membrane, ear canal and external ear normal.  ?   Nose: Nose normal.  ?   Right Sinus: No maxillary sinus tenderness or frontal sinus tenderness.  ?   Left Sinus: No maxillary sinus tenderness or frontal sinus tenderness.  ?   Mouth/Throat:  ?   Lips: Pink.  ?   Mouth: Mucous membranes are moist.  ?   Pharynx: Oropharynx is clear.  ?Eyes:  ?   General: Lids are normal. Vision grossly intact.  ?   Extraocular Movements: Extraocular movements intact.  ?   Conjunctiva/sclera: Conjunctivae normal.  ?   Pupils: Pupils are equal, round, and reactive to light.  ?   Funduscopic exam: ?   Right eye: No hemorrhage. Red reflex present.     ?   Left eye: No hemorrhage. Red reflex present. ?   Visual Fields: Right eye visual fields normal and left eye visual fields normal.  ?Neck:  ?   Thyroid: No thyromegaly.  ?   Vascular: No carotid bruit.  ?Cardiovascular:  ?   Rate and Rhythm: Normal rate and regular rhythm.  ?   Chest Wall: PMI is not displaced.  ?   Pulses: Normal pulses.  ?   Heart sounds: Normal heart sounds. No murmur heard. ?Pulmonary:  ?   Effort: Pulmonary effort is normal. No  respiratory distress.  ?   Breath sounds: Normal breath sounds.  ?Chest:  ?   Chest wall: No mass, deformity or tenderness.  ?Breasts: ?   Breasts are symmetrical.  ?   Right: Normal.  ?   Left: Normal.  ?  Abdominal:  ?   General: Bowel sounds are normal. There is no distension or abdominal bruit.  ?   Palpations: Abdomen is soft. There is no hepatomegaly, splenomegaly or mass.  ?   Tenderness: There is no abdominal tenderness. There is no right CVA tenderness, left CVA tenderness or guarding.  ?   Hernia: No hernia is present. There is no hernia in the left inguinal area or right inguinal area.  ?Genitourinary: ?   General: Normal vulva.  ?   Exam position: Lithotomy position.  ?   Pubic Area: No rash.   ?   Tanner stage (genital): 5.  ?   Labia:     ?   Right: No rash or tenderness.     ?   Left: No rash.   ?   Urethra: No prolapse or urethral swelling.  ?   Vagina: Normal. No vaginal discharge.  ?   Cervix: Normal.  ?   Uterus: Normal.   ?   Adnexa: Right adnexa normal and left adnexa normal.  ?   Rectum: Normal.  ?Musculoskeletal:     ?   General: Normal range of motion.  ?   Cervical back: Full passive range of motion without pain, normal range of motion and neck supple. No tenderness.  ?   Right lower leg: No edema.  ?   Left lower leg: No edema.  ?Feet:  ?   Right foot:  ?   Skin integrity: Skin integrity normal.  ?   Toenail Condition: Right toenails are normal.  ?   Left foot:  ?   Skin integrity: Skin integrity normal.  ?   Toenail Condition: Left toenails are normal.  ?Lymphadenopathy:  ?   Cervical: No cervical adenopathy.  ?   Upper Body:  ?   Right upper body: No supraclavicular, axillary or pectoral adenopathy.  ?   Left upper body: No supraclavicular, axillary or pectoral adenopathy.  ?   Lower Body: No right inguinal adenopathy.  ?Skin: ?   General: Skin is warm and dry.  ?   Capillary Refill: Capillary refill takes less than 2 seconds.  ?   Nails: There is no clubbing.  ?Neurological:  ?   General:  No focal deficit present.  ?   Mental Status: She is alert and oriented to person, place, and time.  ?   Cranial Nerves: No cranial nerve deficit.  ?   Sensory: Sensation is intact. No sensory deficit.  ?   Motor: Motor fun

## 2022-03-28 NOTE — Patient Instructions (Addendum)
It was a pleasure seeing you today. I hope your time spent with [place] was pleasant and helpful. Please let us know if there is anything we can do to improve the service you receive.  ? ?Today we discussed concerns with: ? ?Encounter for annual physical exam ?Everything looks great!!! I will let you know the results of the pap test and labs ? ?Pap smear for cervical cancer screening ? ? ? ?Important Office Information ?Lab Results ?If labs were ordered, please note that you will see results through MyChart as soon as they come available from [facility].  ?It takes up to 5 business days for the results to be routed to me and for me to review them once all of the lab results have come through from [facility]. I will make recommendations based on your results and send these through MyChart or someone from the office will call you to discuss. If your labs are abnormal, we may contact you to schedule a visit to discuss the results and make recommendations.  ?If you have not heard from [place] within 5 business days or you have waited longer than a week and your lab results have not come through on MyChart, please feel free to call the office or send a message through MyChart to follow-up on these labs.  ? ?Referrals ?If referrals were placed today, the office where the referral was sent will contact you either by phone or through MyChart to set up scheduling. Please note that it can take up to a week for the referral office to contact you. If you do not hear from them in a week, please contact the referral office directly to inquire about scheduling.  ? ?Condition Treated ?If your condition worsens or you begin to have new symptoms, please schedule a follow-up appointment for further evaluation. If you are not sure if an appointment is needed, you may call the office to leave a message for the nurse and someone will contact you with recommendations.  ?If you have an urgent or life threatening emergency, please do not call the office,  but seek emergency evaluation by calling 911 or going to the nearest emergency room for evaluation.  ? ?MyChart and Phone Calls ?Please do not use MyChart for urgent messages. It may take up to 3 business days for MyChart messages to be read by staff and if they are unable to handle the request, an additional 3 business days for them to be routed to me and for my response.  ?Messages sent to the provider through MyChart do not come directly to the provider, please allow time for these messages to be routed and for me to respond.  ?We get a large volume of MyChart messages daily and these are responded to in the order received.  ? ?For urgent messages, please call the office at [phone] and speak with the front office staff or leave a message on the line of my assistant for guidance.  ?We are seeing patients from the hours of 8:00 am through 5:00 pm and calls directly to the nurse may not be answered immediately due to seeing patients, but your call will be returned as soon as possible.  ?Phone  messages received after 4:00 PM Monday through Thursday may not be returned until the following business day. Phone messages received after 11:00 AM on Friday may not be returned until Monday.  ? ?After Hours ?We share on call hours with providers from other offices. If you have an urgent need  after hours that cannot wait until the next business day, please contact the on call provider by calling the office number. A nurse will speak with you and contact the provider if needed for recommendations.  ?If you have an urgent or life threatening emergency after hours, please do not call the on call provider, but seek emergency evaluation by calling 911 or going to the nearest emergency room for evaluation.  ? ?Paperwork ?All paperwork requires a minimum of 5 days to complete and return to you or the designated personnel. Please keep this in mind when bringing in forms or sending requests for paperwork completion to the office.   ?  ?

## 2022-03-29 LAB — COMPREHENSIVE METABOLIC PANEL
ALT: 34 IU/L — ABNORMAL HIGH (ref 0–32)
AST: 27 IU/L (ref 0–40)
Albumin/Globulin Ratio: 1.6 (ref 1.2–2.2)
Albumin: 4.6 g/dL (ref 3.8–4.9)
Alkaline Phosphatase: 118 IU/L (ref 44–121)
BUN/Creatinine Ratio: 23 (ref 9–23)
BUN: 14 mg/dL (ref 6–24)
Bilirubin Total: 0.4 mg/dL (ref 0.0–1.2)
CO2: 22 mmol/L (ref 20–29)
Calcium: 9.8 mg/dL (ref 8.7–10.2)
Chloride: 103 mmol/L (ref 96–106)
Creatinine, Ser: 0.62 mg/dL (ref 0.57–1.00)
Globulin, Total: 2.8 g/dL (ref 1.5–4.5)
Glucose: 97 mg/dL (ref 70–99)
Potassium: 4 mmol/L (ref 3.5–5.2)
Sodium: 141 mmol/L (ref 134–144)
Total Protein: 7.4 g/dL (ref 6.0–8.5)
eGFR: 104 mL/min/{1.73_m2} (ref >59)

## 2022-03-29 LAB — CBC WITH DIFFERENTIAL/PLATELET
Basophils Absolute: 0.1 10*3/uL (ref 0.0–0.2)
Basos: 1 %
EOS (ABSOLUTE): 0.3 10*3/uL (ref 0.0–0.4)
Eos: 4 %
Hematocrit: 43.5 % (ref 34.0–46.6)
Hemoglobin: 15.1 g/dL (ref 11.1–15.9)
Immature Grans (Abs): 0 10*3/uL (ref 0.0–0.1)
Immature Granulocytes: 0 %
Lymphocytes Absolute: 2.1 10*3/uL (ref 0.7–3.1)
Lymphs: 33 %
MCH: 30.3 pg (ref 26.6–33.0)
MCHC: 34.7 g/dL (ref 31.5–35.7)
MCV: 87 fL (ref 79–97)
Monocytes Absolute: 0.5 10*3/uL (ref 0.1–0.9)
Monocytes: 8 %
Neutrophils Absolute: 3.4 10*3/uL (ref 1.4–7.0)
Neutrophils: 54 %
Platelets: 243 10*3/uL (ref 150–450)
RBC: 4.99 x10E6/uL (ref 3.77–5.28)
RDW: 12.8 % (ref 11.7–15.4)
WBC: 6.4 10*3/uL (ref 3.4–10.8)

## 2022-03-29 LAB — HEMOGLOBIN A1C
Est. average glucose Bld gHb Est-mCnc: 120 mg/dL
Hgb A1c MFr Bld: 5.8 % — ABNORMAL HIGH (ref 4.8–5.6)

## 2022-03-29 LAB — LIPID PANEL
Chol/HDL Ratio: 4.9 ratio — ABNORMAL HIGH (ref 0.0–4.4)
Cholesterol, Total: 219 mg/dL — ABNORMAL HIGH (ref 100–199)
HDL: 45 mg/dL (ref >39)
LDL Chol Calc (NIH): 130 mg/dL — ABNORMAL HIGH (ref 0–99)
Triglycerides: 247 mg/dL — ABNORMAL HIGH (ref 0–149)
VLDL Cholesterol Cal: 44 mg/dL — ABNORMAL HIGH (ref 5–40)

## 2022-03-29 LAB — TSH: TSH: 1.96 u[IU]/mL (ref 0.450–4.500)

## 2022-03-29 LAB — CYTOLOGY - PAP
Comment: NEGATIVE
Diagnosis: NEGATIVE
High risk HPV: NEGATIVE

## 2022-03-29 LAB — VITAMIN D 25 HYDROXY (VIT D DEFICIENCY, FRACTURES): Vit D, 25-Hydroxy: 16.9 ng/mL — ABNORMAL LOW (ref 30.0–100.0)

## 2022-03-31 ENCOUNTER — Encounter (HOSPITAL_BASED_OUTPATIENT_CLINIC_OR_DEPARTMENT_OTHER): Payer: Self-pay | Admitting: Nurse Practitioner

## 2022-04-05 ENCOUNTER — Other Ambulatory Visit (HOSPITAL_BASED_OUTPATIENT_CLINIC_OR_DEPARTMENT_OTHER): Payer: Self-pay | Admitting: Nurse Practitioner

## 2022-04-05 DIAGNOSIS — R7303 Prediabetes: Secondary | ICD-10-CM

## 2022-04-05 DIAGNOSIS — E782 Mixed hyperlipidemia: Secondary | ICD-10-CM

## 2022-04-05 DIAGNOSIS — E559 Vitamin D deficiency, unspecified: Secondary | ICD-10-CM

## 2022-04-05 MED ORDER — METFORMIN HCL 500 MG PO TABS: 500.0000 mg | ORAL_TABLET | Freq: Two times a day (BID) | ORAL | 3 refills | Status: DC

## 2022-04-05 MED ORDER — ROSUVASTATIN CALCIUM 10 MG PO TABS: 10.0000 mg | ORAL_TABLET | Freq: Every day | ORAL | 3 refills | Status: DC

## 2022-04-05 MED ORDER — VITAMIN D (ERGOCALCIFEROL) 1.25 MG (50000 UNIT) PO CAPS: 50000.0000 [IU] | ORAL_CAPSULE | ORAL | 0 refills | Status: DC

## 2022-04-07 ENCOUNTER — Other Ambulatory Visit (HOSPITAL_BASED_OUTPATIENT_CLINIC_OR_DEPARTMENT_OTHER): Payer: Self-pay | Admitting: Nurse Practitioner

## 2022-04-07 DIAGNOSIS — E782 Mixed hyperlipidemia: Secondary | ICD-10-CM

## 2022-04-07 DIAGNOSIS — R7303 Prediabetes: Secondary | ICD-10-CM

## 2022-04-07 DIAGNOSIS — E559 Vitamin D deficiency, unspecified: Secondary | ICD-10-CM

## 2022-04-07 MED ORDER — ROSUVASTATIN CALCIUM 10 MG PO TABS: 10.0000 mg | ORAL_TABLET | Freq: Every day | ORAL | 3 refills | Status: DC

## 2022-04-07 MED ORDER — METFORMIN HCL 500 MG PO TABS: 500.0000 mg | ORAL_TABLET | Freq: Two times a day (BID) | ORAL | 3 refills | Status: DC

## 2022-04-07 MED ORDER — VITAMIN D (ERGOCALCIFEROL) 1.25 MG (50000 UNIT) PO CAPS: 50000.0000 [IU] | ORAL_CAPSULE | ORAL | 0 refills | Status: DC

## 2022-06-29 ENCOUNTER — Ambulatory Visit (INDEPENDENT_AMBULATORY_CARE_PROVIDER_SITE_OTHER): Payer: 59 | Admitting: Nurse Practitioner

## 2022-06-29 ENCOUNTER — Encounter (HOSPITAL_BASED_OUTPATIENT_CLINIC_OR_DEPARTMENT_OTHER): Payer: Self-pay | Admitting: Nurse Practitioner

## 2022-06-29 VITALS — BP 122/82 | HR 65 | Ht 67.0 in | Wt 177.0 lb

## 2022-06-29 DIAGNOSIS — F5104 Psychophysiologic insomnia: Secondary | ICD-10-CM

## 2022-06-29 DIAGNOSIS — E559 Vitamin D deficiency, unspecified: Secondary | ICD-10-CM | POA: Diagnosis not present

## 2022-06-29 DIAGNOSIS — R7303 Prediabetes: Secondary | ICD-10-CM | POA: Diagnosis not present

## 2022-06-29 DIAGNOSIS — I10 Essential (primary) hypertension: Secondary | ICD-10-CM | POA: Diagnosis not present

## 2022-06-29 DIAGNOSIS — E782 Mixed hyperlipidemia: Secondary | ICD-10-CM

## 2022-06-29 LAB — POCT GLYCOSYLATED HEMOGLOBIN (HGB A1C)
HbA1c POC (<> result, manual entry): 5.9 % (ref 4.0–5.6)
HbA1c, POC (controlled diabetic range): 5.9 % (ref 0.0–7.0)
HbA1c, POC (prediabetic range): 5.9 % (ref 5.7–6.4)
Hemoglobin A1C: 5.9 % — AB (ref 4.0–5.6)

## 2022-06-29 MED ORDER — HYDROXYZINE PAMOATE 25 MG PO CAPS: 25.0000 mg | ORAL_CAPSULE | Freq: Every evening | ORAL | 6 refills | Status: DC | PRN

## 2022-06-29 MED ORDER — AMLODIPINE BESYLATE 5 MG PO TABS: 5.0000 mg | ORAL_TABLET | Freq: Every day | ORAL | 3 refills | Status: DC

## 2022-06-29 MED ORDER — METOPROLOL TARTRATE 25 MG PO TABS: 25.0000 mg | ORAL_TABLET | Freq: Two times a day (BID) | ORAL | 3 refills | Status: DC

## 2022-06-29 NOTE — Assessment & Plan Note (Signed)
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  Discussed option with patient to trial hydroxyzine at bedtime as needed for sleep.  She is interested in this medication. We will send prescription in today and monitor for response.  If patient is not experiencing adequate response from medication she will follow-up in the next few weeks.

## 2022-06-29 NOTE — Assessment & Plan Note (Signed)
Chronic.  Currently taking rosuvastatin.  No noted side effects of medication.  No alarm symptoms present at this time.  No abnormal findings on cardiovascular examination or signs of endorgan damage. Recommend continuation of rosuvastatin and monitoring diet with an intake of less than 13 g of saturated fat daily.  Recommend 150 to 180 g of carbohydrates divided throughout the day and healthy low-fat proteins with each meal.  Recommend monitoring blood pressure with goal of less than 130/85.  Recommend 150 minutes of cardiovascular activity week please. Labs today.  Refills today.  We will plan to follow-up in 6 months or sooner if needed.

## 2022-06-29 NOTE — Assessment & Plan Note (Signed)
Chronic.  Well-controlled on low-dose amlodipine and metoprolol.  No alarm symptoms present at this time.  Taking medication as prescribed.  No side effects noted. No concerning findings on cardiovascular examination or signs of endorgan damage. Recommend continuation of amlodipine 5 mg and metoprolol daily. Recommend monitoring blood pressures at home with goal less than 130/85. Recommend 150 minutes of cardiovascular activity weekly.  Diet should conclude no more than 13 g of saturated fat per day divided throughout the day, 130 to 150 g of carbohydrates divided throughout the day, and a good mix of healthy low-fat proteins. Will obtain labs today for evaluation.  Refills provided on medication. We will plan follow-up in 6 months or sooner if needed.

## 2022-06-29 NOTE — Assessment & Plan Note (Signed)
Chronic.  Hemoglobin A1c stable at this time.  She is tolerating medication well with no side effects.  No abnormal findings on examination today. Recommend close monitoring of diet with intake of 150 to 180 g of carbohydrates divided throughout the day, less than 13 g of saturated fat divided throughout the day, and intake of healthy low-fat proteins with each meal.  Recommend at least 150 minutes of cardiovascular activity weekly. Continue metformin. We will plan to follow-up in 6 months or sooner if needed.

## 2022-06-29 NOTE — Patient Instructions (Signed)
I AM SO PROUD OF YOU!! KEEP UP THE GREAT WORK!!

## 2022-06-29 NOTE — Progress Notes (Signed)
Maureen Clamp, DNP, AGNP-c [facility] [address] [place], [place] [address] [phone] Office [phone] Fax  ESTABLISHED PATIENT- Chronic Health and/or Follow-Up Visit  Blood pressure 122/82, pulse 65, height 5\' 7"  (1.702 m), weight 177 lb (80.3 kg), SpO2 95 %.  Follow-up (She is done with vit D. She was in a lot of pain while taking. Having trouble sleeping.Other wise she is feeling good. )   HPI  Maureen Drake  is a 56 y.o. year old female presenting today for evaluation and management of the following: Prediabetes [name] tells me that she is working to follow a low carbohydrate diet She is not checking her blood sugars routinely She denies increased hunger, increased thirst, increased urination, vision changes, chest pain, shortness of breath, paresthesias She is tolerating metformin well without any negative side effects She is taking the medication as prescribed with no missed doses Insomnia [name] endorses difficulty falling asleep at night due to difficulty shutting her mind down She tells me that often times she will be awake late into the night, sometimes 3 to 4:00 in the morning She has not tried anything to help with sleep in the past She is not interested in anything that may be habit-forming or required every night She does follow a good sleep hygiene schedule and avoids caffeine late in the day Hypertension [name] denies any concerning signs or symptoms present today She is taking her medication as prescribed without any missed doses She denies shortness of breath, chest pain, vision changes, lower extremity edema, dizziness She is monitoring her diet and working to follow low-fat and low-sodium Vitamin D deficiency [name] tells me that she has completed her vitamin D replacement. She did have some muscle pain and nausea while taking the medication however she was able to complete the entire course She tells me that the  symptoms have resolved since stopping the medication  ROS All ROS negative with exception of what is listed in HPI  PHYSICAL EXAM Physical Exam Vitals and nursing note reviewed.  Constitutional:      Appearance: Normal appearance.  HENT:     Head: Normocephalic.  Eyes:     Extraocular Movements: Extraocular movements intact.     Pupils: Pupils are equal, round, and reactive to light.  Neck:     Vascular: No carotid bruit.  Cardiovascular:     Rate and Rhythm: Normal rate and regular rhythm.     Pulses: Normal pulses.     Heart sounds: Normal heart sounds.  Pulmonary:     Effort: Pulmonary effort is normal.     Breath sounds: Normal breath sounds.  Abdominal:     General: Bowel sounds are normal.     Palpations: Abdomen is soft.  Musculoskeletal:     Cervical back: Normal range of motion.     Right lower leg: No edema.     Left lower leg: No edema.  Skin:    General: Skin is warm and dry.     Capillary Refill: Capillary refill takes less than 2 seconds.  Neurological:     General: No focal deficit present.     Mental Status: She is alert and oriented to person, place, and time.  Psychiatric:        Mood and Affect: Mood normal.        Behavior: Behavior normal.        Thought Content: Thought content normal.        Judgment: Judgment normal.  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  Discussed option with patient to trial hydroxyzine at bedtime as needed for sleep.  She is interested in this medication. We will send prescription in today and monitor for response.  If patient is not experiencing adequate response from medication she will follow-up in the next few weeks.      Relevant Medications   hydrOXYzine (VISTARIL) 25 MG capsule   Pre-diabetes    Chronic.  Hemoglobin A1c stable at this time.  She is tolerating medication well  with no side effects.  No abnormal findings on examination today. Recommend close monitoring of diet with intake of 150 to 180 g of carbohydrates divided throughout the day, less than 13 g of saturated fat divided throughout the day, and intake of healthy low-fat proteins with each meal.  Recommend at least 150 minutes of cardiovascular activity weekly. Continue metformin. We will plan to follow-up in 6 months or sooner if needed.      Relevant Orders   POCT glycosylated hemoglobin (Hb A1C) (Completed)   Comprehensive metabolic panel   CBC with Differential/Platelet   Vitamin D deficiency    Completion of vitamin D replacement therapy.  Patient did have adverse effects while taking the high-dose medication but these have resolved at this time.  We will plan to recheck vitamin D levels today to ensure that they have come up to normal readings.   Recommend healthy diet with calcium and vitamin D fortified foods. We will plan to follow-up in 6 months or sooner if needed.      Relevant Orders   VITAMIN D 25 Hydroxy (Vit-D Deficiency, Fractures)   Mixed hyperlipidemia    Chronic.  Currently taking rosuvastatin.  No noted side effects of medication.  No alarm symptoms present at this time.  No abnormal findings on cardiovascular examination or signs of endorgan damage. Recommend continuation of rosuvastatin and monitoring diet with an intake of less than 13 g of saturated fat daily.  Recommend 150 to 180 g of carbohydrates divided throughout the day and healthy low-fat proteins with each meal.  Recommend monitoring blood pressure with goal of less than 130/85.  Recommend 150 minutes of cardiovascular activity week please. Labs today.  Refills today.  We will plan to follow-up in 6 months or sooner if needed.       Relevant Medications   metoprolol tartrate (LOPRESSOR) 25 MG tablet   amLODipine (NORVASC) 5 MG tablet   Other Relevant Orders   Lipid panel   CBC with Differential/Platelet    Primary hypertension - Primary    Chronic.  Well-controlled on low-dose amlodipine and metoprolol.  No alarm symptoms present at this time.  Taking medication as prescribed.  No side effects noted. No concerning findings on cardiovascular examination or signs of endorgan damage. Recommend continuation of amlodipine 5 mg and metoprolol daily. Recommend monitoring blood pressures at home with goal less than 130/85. Recommend 150 minutes of cardiovascular activity weekly.  Diet should conclude no more than 13 g of saturated fat per day divided throughout the day, 130 to 150 g of carbohydrates divided throughout the day, and a good mix of healthy low-fat proteins. Will obtain labs today for evaluation.  Refills provided on medication. We will plan follow-up in 6 months or sooner if needed.      Relevant Medications   metoprolol tartrate (LOPRESSOR) 25 MG  tablet   amLODipine (NORVASC) 5 MG tablet     FOLLOW-UP Return in about 6 months (around 12/30/2022) for PRE-DM HTN.    Maureen Clamp, DNP, AGNP-c 06/29/2022  9:30 AM

## 2022-06-29 NOTE — Assessment & Plan Note (Signed)
Completion of vitamin D replacement therapy.  Patient did have adverse effects while taking the high-dose medication but these have resolved at this time.  We will plan to recheck vitamin D levels today to ensure that they have come up to normal readings.   Recommend healthy diet with calcium and vitamin D fortified foods. We will plan to follow-up in 6 months or sooner if needed.

## 2022-06-30 ENCOUNTER — Other Ambulatory Visit (HOSPITAL_BASED_OUTPATIENT_CLINIC_OR_DEPARTMENT_OTHER): Payer: Self-pay | Admitting: Nurse Practitioner

## 2022-06-30 DIAGNOSIS — E559 Vitamin D deficiency, unspecified: Secondary | ICD-10-CM

## 2022-06-30 LAB — COMPREHENSIVE METABOLIC PANEL
ALT: 23 IU/L (ref 0–32)
AST: 21 IU/L (ref 0–40)
Albumin/Globulin Ratio: 1.6 (ref 1.2–2.2)
Albumin: 4.8 g/dL (ref 3.8–4.9)
Alkaline Phosphatase: 96 IU/L (ref 44–121)
BUN/Creatinine Ratio: 14 (ref 9–23)
BUN: 10 mg/dL (ref 6–24)
Bilirubin Total: 0.7 mg/dL (ref 0.0–1.2)
CO2: 21 mmol/L (ref 20–29)
Calcium: 9.7 mg/dL (ref 8.7–10.2)
Chloride: 104 mmol/L (ref 96–106)
Creatinine, Ser: 0.7 mg/dL (ref 0.57–1.00)
Globulin, Total: 3 g/dL (ref 1.5–4.5)
Glucose: 96 mg/dL (ref 70–99)
Potassium: 4.4 mmol/L (ref 3.5–5.2)
Sodium: 141 mmol/L (ref 134–144)
Total Protein: 7.8 g/dL (ref 6.0–8.5)
eGFR: 101 mL/min/{1.73_m2} (ref >59)

## 2022-06-30 LAB — CBC WITH DIFFERENTIAL/PLATELET
Basophils Absolute: 0.1 10*3/uL (ref 0.0–0.2)
Basos: 1 %
EOS (ABSOLUTE): 0.2 10*3/uL (ref 0.0–0.4)
Eos: 4 %
Hematocrit: 43.7 % (ref 34.0–46.6)
Hemoglobin: 15 g/dL (ref 11.1–15.9)
Immature Grans (Abs): 0 10*3/uL (ref 0.0–0.1)
Immature Granulocytes: 0 %
Lymphocytes Absolute: 1.9 10*3/uL (ref 0.7–3.1)
Lymphs: 32 %
MCH: 30.5 pg (ref 26.6–33.0)
MCHC: 34.3 g/dL (ref 31.5–35.7)
MCV: 89 fL (ref 79–97)
Monocytes Absolute: 0.6 10*3/uL (ref 0.1–0.9)
Monocytes: 9 %
Neutrophils Absolute: 3.3 10*3/uL (ref 1.4–7.0)
Neutrophils: 54 %
Platelets: 226 10*3/uL (ref 150–450)
RBC: 4.91 x10E6/uL (ref 3.77–5.28)
RDW: 12.4 % (ref 11.7–15.4)
WBC: 6.1 10*3/uL (ref 3.4–10.8)

## 2022-06-30 LAB — LIPID PANEL
Chol/HDL Ratio: 3 ratio (ref 0.0–4.4)
Cholesterol, Total: 134 mg/dL (ref 100–199)
HDL: 44 mg/dL (ref >39)
LDL Chol Calc (NIH): 67 mg/dL (ref 0–99)
Triglycerides: 130 mg/dL (ref 0–149)
VLDL Cholesterol Cal: 23 mg/dL (ref 5–40)

## 2022-06-30 LAB — VITAMIN D 25 HYDROXY (VIT D DEFICIENCY, FRACTURES): Vit D, 25-Hydroxy: 40.3 ng/mL (ref 30.0–100.0)

## 2023-01-01 ENCOUNTER — Ambulatory Visit (HOSPITAL_BASED_OUTPATIENT_CLINIC_OR_DEPARTMENT_OTHER): Payer: 59 | Admitting: Family Medicine

## 2023-01-01 ENCOUNTER — Ambulatory Visit (HOSPITAL_BASED_OUTPATIENT_CLINIC_OR_DEPARTMENT_OTHER): Payer: 59 | Admitting: Nurse Practitioner

## 2023-01-01 ENCOUNTER — Encounter: Payer: Self-pay | Admitting: Nurse Practitioner

## 2023-01-01 ENCOUNTER — Ambulatory Visit: Payer: Medicaid Other | Admitting: Nurse Practitioner

## 2023-01-01 VITALS — BP 118/78 | HR 72 | Temp 98.5°F | Wt 172.0 lb

## 2023-01-01 DIAGNOSIS — E782 Mixed hyperlipidemia: Secondary | ICD-10-CM | POA: Diagnosis not present

## 2023-01-01 DIAGNOSIS — R7303 Prediabetes: Secondary | ICD-10-CM | POA: Diagnosis not present

## 2023-01-01 DIAGNOSIS — I10 Essential (primary) hypertension: Secondary | ICD-10-CM

## 2023-01-01 DIAGNOSIS — F5104 Psychophysiologic insomnia: Secondary | ICD-10-CM

## 2023-01-01 DIAGNOSIS — G8929 Other chronic pain: Secondary | ICD-10-CM

## 2023-01-01 DIAGNOSIS — E559 Vitamin D deficiency, unspecified: Secondary | ICD-10-CM | POA: Diagnosis not present

## 2023-01-01 DIAGNOSIS — M25572 Pain in left ankle and joints of left foot: Secondary | ICD-10-CM

## 2023-01-01 LAB — POCT GLYCOSYLATED HEMOGLOBIN (HGB A1C): Hemoglobin A1C: 6.1 % — AB (ref 4.0–5.6)

## 2023-01-01 MED ORDER — VITAMIN D (ERGOCALCIFEROL) 1.25 MG (50000 UNIT) PO CAPS: 50000.0000 [IU] | ORAL_CAPSULE | ORAL | 3 refills | Status: DC

## 2023-01-01 MED ORDER — METFORMIN HCL 500 MG PO TABS: 500.0000 mg | ORAL_TABLET | Freq: Two times a day (BID) | ORAL | 3 refills | Status: DC

## 2023-01-01 MED ORDER — HYDROXYZINE PAMOATE 25 MG PO CAPS: 25.0000 mg | ORAL_CAPSULE | Freq: Every evening | ORAL | 6 refills | Status: DC | PRN

## 2023-01-01 MED ORDER — ROSUVASTATIN CALCIUM 10 MG PO TABS: 10.0000 mg | ORAL_TABLET | Freq: Every day | ORAL | 3 refills | Status: DC

## 2023-01-01 NOTE — Patient Instructions (Signed)
It was a pleasure seeing you today! Thank you for trusting me with your care.   If labs were collected today, you will see the results as soon as they become available in MyChart. I will review these labs once I have received all of the results and send you comments and recommendations, if any. If you have specific concerns, we can set up an appointment (virtual or in person) to go over details and come up with a plan together.   If you have received any referrals today, the office where the referral was made will be in contact with you to set up your appointment.   If you have received orders for imaging today, the imaging office will contact you to schedule this. Please note that some imaging requires a prior authorization from insurance, so an order is not a guarantee this will be covered by your insurance, but I want to assure you we will do our best to get this covered and if it is not, you will be notified and we can come up with an alternative plan.   If you take regular prescription medications, please contact your pharmacy for routine refill requests. They will send this directly to us.  If you were ordered new medication as a part of your examination and treatment today, please contact your pharmacy to determine the status. Many prescriptions require a prior authorization and the pharmacy will contact us to get this started. This may take a few days for approval or denial. If the medication is denied, we will work with you to try alternative medications.   If you have any questions or concerns, please do not hesitate to contact the office via telephone or MyChart.  MyChart messages are received by the CMA staff during regular business hours Monday through Friday and we do our best to respond in a timely manner.  If your request requires an appointment, the staff will gladly help set that up so that we have the time dedicated to ensure that your questions are appropriately answered.     

## 2023-01-01 NOTE — Progress Notes (Signed)
Worthy Keeler, DNP, AGNP-c [facility]  [address] [place], [place] [address] [phone]  ESTABLISHED PATIENT- Chronic Health and/or Follow-Up Visit  Blood pressure 118/78, pulse 72, temperature 98.5 F (36.9 C), weight 172 lb (78 kg).    Maureen Drake is a 57 y.o. year old female presenting today for evaluation and management of the following:  Diabetes Maureen Drake is doing very well with her diabetes control. She is watching what she eats and trying to get physical activity routinely. She is watching her grandchildren which helps to keep her active. She is taking her medication as prescribed and is not having any side effects. She checks her blood sugars and they have been in normal range. She is not having increased thirst, hunger, or urination.   Vitamin D deficiency Maureen Drake has a history of vitamin d deficiency and in the past experienced muscle pain when this was low. She was previously on high dose replacement for this and her pain went away, but she tells me that over the past several weeks she has noticed the pain, particularly in her shoulder and ankle, come back. She is not taking any supplementation at this time.   Lipids Maureen Drake is on statin therapy and monitoring her diet. She is not having any side effects from the medications and she is taking this as prescribed. She is keeping her blood sugars and blood pressure controlled.   HTN Maureen Drake is taking her medication for her blood pressure and watching her diet. She is not having any chest pain, shortness of breath, dizziness, or vision changes. She tells me her home BP readings are good. She is not having any side effects from the medications.   All ROS negative with exception of what is listed above.   PHYSICAL EXAM Physical Exam Vitals and nursing note reviewed.  Constitutional:      General: She is not in acute distress.    Appearance: Normal appearance.  HENT:     Head: Normocephalic.  Eyes:      Extraocular Movements: Extraocular movements intact.     Conjunctiva/sclera: Conjunctivae normal.     Pupils: Pupils are equal, round, and reactive to light.  Neck:     Vascular: No carotid bruit.  Cardiovascular:     Rate and Rhythm: Normal rate and regular rhythm.     Pulses: Normal pulses.     Heart sounds: Normal heart sounds. No murmur heard. Pulmonary:     Effort: Pulmonary effort is normal.     Breath sounds: Normal breath sounds. No wheezing.  Abdominal:     General: Bowel sounds are normal. There is no distension.     Palpations: Abdomen is soft.     Tenderness: There is no abdominal tenderness. There is no guarding.  Musculoskeletal:        General: Normal range of motion.     Cervical back: Normal range of motion and neck supple.     Right lower leg: No edema.     Left lower leg: No edema.  Lymphadenopathy:     Cervical: No cervical adenopathy.  Skin:    General: Skin is warm and dry.     Capillary Refill: Capillary refill takes less than 2 seconds.  Neurological:     General: No focal deficit present.     Mental Status: She is alert and oriented to person, place, and time.  Psychiatric:        Mood and Affect: Mood normal.        Behavior:  Behavior normal.        Thought Content: Thought content normal.        Judgment: Judgment normal.     PLAN Problem List Items Addressed This Visit     Chronic pain of left ankle    Will recheck her vitamin D levels today as this was a sign when her levels were low in the past.       Relevant Medications   Vitamin D, Ergocalciferol, (DRISDOL) 1.25 MG (50000 UNIT) CAPS capsule   Other Relevant Orders   CBC with Differential/Platelet (Completed)   Comprehensive metabolic panel (Completed)   Lipid panel (Completed)   VITAMIN D 25 Hydroxy (Vit-D Deficiency, Fractures) (Completed)   Psychophysiological insomnia    Chronic. Hydroxyzine is helpful for management. Will refill today. Try 1/2 tab if too much sleepiness occurs.        Relevant Medications   hydrOXYzine (VISTARIL) 25 MG capsule   Pre-diabetes - Primary    Chronic. Her blood sugars are well controlled and she is not having any negative effects from the medications at this time. Her hemoglobin A1c is great today! We will plan to continue her current regimen and monitoring. Diet and exercise recommendations reviewed.       Relevant Medications   Vitamin D, Ergocalciferol, (DRISDOL) 1.25 MG (50000 UNIT) CAPS capsule   rosuvastatin (CRESTOR) 10 MG tablet   metFORMIN (GLUCOPHAGE) 500 MG tablet   Other Relevant Orders   HgB A1c (Completed)   CBC with Differential/Platelet (Completed)   Comprehensive metabolic panel (Completed)   Lipid panel (Completed)   VITAMIN D 25 Hydroxy (Vit-D Deficiency, Fractures) (Completed)   Vitamin D deficiency    Previous finding with improvement of joint pain after treatment. Unfortunately, joint pain has returned now that she has stopped the supplement. We will recheck this today.       Relevant Medications   Vitamin D, Ergocalciferol, (DRISDOL) 1.25 MG (50000 UNIT) CAPS capsule   Other Relevant Orders   CBC with Differential/Platelet (Completed)   Comprehensive metabolic panel (Completed)   Lipid panel (Completed)   VITAMIN D 25 Hydroxy (Vit-D Deficiency, Fractures) (Completed)   Mixed hyperlipidemia    Chronic. Controlled with  statin therapy . Continue current lipid-lowering therapy.. orders written for new lab studies as appropriate; see orders. Recommend Treatment of diabetes (continued) Treatment of hypertension (cotinued) Weight reduction (continued) as these are associated with this diagnosis. Diet and exercise recommendations provided. Follow-up in 37months or sooner based on lab findings as appropriate.        Relevant Medications   Vitamin D, Ergocalciferol, (DRISDOL) 1.25 MG (50000 UNIT) CAPS capsule   rosuvastatin (CRESTOR) 10 MG tablet   Other Relevant Orders   CBC with Differential/Platelet  (Completed)   Comprehensive metabolic panel (Completed)   Lipid panel (Completed)   VITAMIN D 25 Hydroxy (Vit-D Deficiency, Fractures) (Completed)   Primary hypertension    Chronic.  No alarm symptoms present at this time.  Goal blood pressure less than less than 130/80.  Refills have been provided today.  Labs today to check electrolytes and kidney function.  Recommend current treatment plan is effective, no change in therapy, orders and follow up as documented in EpicCare, reviewed diet, exercise and weight control, labs ordered and recent results reviewed with patient. Currently is followed with cardiology. We will make changes to plan of care as necessary based on lab results. Plan to follow-up in 61months.       Relevant Medications   Vitamin D, Ergocalciferol, (  DRISDOL) 1.25 MG (50000 UNIT) CAPS capsule   rosuvastatin (CRESTOR) 10 MG tablet   Other Relevant Orders   CBC with Differential/Platelet (Completed)   Comprehensive metabolic panel (Completed)   Lipid panel (Completed)   VITAMIN D 25 Hydroxy (Vit-D Deficiency, Fractures) (Completed)    Return in about 6 months (around 07/02/2023).   Shawna Clamp, DNP, AGNP-c 01/01/2023  1:49 PM

## 2023-01-02 LAB — CBC WITH DIFFERENTIAL/PLATELET
Basophils Absolute: 0.1 10*3/uL (ref 0.0–0.2)
Basos: 1 %
EOS (ABSOLUTE): 0.2 10*3/uL (ref 0.0–0.4)
Eos: 3 %
Hematocrit: 43.4 % (ref 34.0–46.6)
Hemoglobin: 14.7 g/dL (ref 11.1–15.9)
Immature Grans (Abs): 0 10*3/uL (ref 0.0–0.1)
Immature Granulocytes: 0 %
Lymphocytes Absolute: 2.1 10*3/uL (ref 0.7–3.1)
Lymphs: 30 %
MCH: 30.4 pg (ref 26.6–33.0)
MCHC: 33.9 g/dL (ref 31.5–35.7)
MCV: 90 fL (ref 79–97)
Monocytes Absolute: 0.5 10*3/uL (ref 0.1–0.9)
Monocytes: 7 %
Neutrophils Absolute: 4.2 10*3/uL (ref 1.4–7.0)
Neutrophils: 59 %
Platelets: 223 10*3/uL (ref 150–450)
RBC: 4.84 x10E6/uL (ref 3.77–5.28)
RDW: 12.7 % (ref 11.7–15.4)
WBC: 7.1 10*3/uL (ref 3.4–10.8)

## 2023-01-02 LAB — VITAMIN D 25 HYDROXY (VIT D DEFICIENCY, FRACTURES): Vit D, 25-Hydroxy: 13.7 ng/mL — ABNORMAL LOW (ref 30.0–100.0)

## 2023-01-02 LAB — LIPID PANEL
Chol/HDL Ratio: 3.4 ratio (ref 0.0–4.4)
Cholesterol, Total: 143 mg/dL (ref 100–199)
HDL: 42 mg/dL (ref >39)
LDL Chol Calc (NIH): 69 mg/dL (ref 0–99)
Triglycerides: 189 mg/dL — ABNORMAL HIGH (ref 0–149)
VLDL Cholesterol Cal: 32 mg/dL (ref 5–40)

## 2023-01-02 LAB — COMPREHENSIVE METABOLIC PANEL
ALT: 21 IU/L (ref 0–32)
AST: 21 IU/L (ref 0–40)
Albumin/Globulin Ratio: 1.6 (ref 1.2–2.2)
Albumin: 4.6 g/dL (ref 3.8–4.9)
Alkaline Phosphatase: 79 IU/L (ref 44–121)
BUN/Creatinine Ratio: 18 (ref 9–23)
BUN: 18 mg/dL (ref 6–24)
Bilirubin Total: 0.4 mg/dL (ref 0.0–1.2)
CO2: 22 mmol/L (ref 20–29)
Calcium: 9.6 mg/dL (ref 8.7–10.2)
Chloride: 105 mmol/L (ref 96–106)
Creatinine, Ser: 0.99 mg/dL (ref 0.57–1.00)
Globulin, Total: 2.9 g/dL (ref 1.5–4.5)
Glucose: 101 mg/dL — ABNORMAL HIGH (ref 70–99)
Potassium: 4.1 mmol/L (ref 3.5–5.2)
Sodium: 142 mmol/L (ref 134–144)
Total Protein: 7.5 g/dL (ref 6.0–8.5)
eGFR: 67 mL/min/{1.73_m2} (ref >59)

## 2023-01-04 NOTE — Assessment & Plan Note (Signed)
Will recheck her vitamin D levels today as this was a sign when her levels were low in the past.

## 2023-01-04 NOTE — Assessment & Plan Note (Signed)
Chronic. Hydroxyzine is helpful for management. Will refill today. Try 1/2 tab if too much sleepiness occurs.

## 2023-01-04 NOTE — Assessment & Plan Note (Signed)
Previous finding with improvement of joint pain after treatment. Unfortunately, joint pain has returned now that she has stopped the supplement. We will recheck this today.

## 2023-01-04 NOTE — Assessment & Plan Note (Signed)
Chronic. Her blood sugars are well controlled and she is not having any negative effects from the medications at this time. Her hemoglobin A1c is great today! We will plan to continue her current regimen and monitoring. Diet and exercise recommendations reviewed.

## 2023-01-04 NOTE — Assessment & Plan Note (Signed)
Chronic. Controlled with  statin therapy . Continue current lipid-lowering therapy.. orders written for new lab studies as appropriate; see orders. Recommend Treatment of diabetes (continued) Treatment of hypertension (cotinued) Weight reduction (continued) as these are associated with this diagnosis. Diet and exercise recommendations provided. Follow-up in 25months or sooner based on lab findings as appropriate.

## 2023-01-04 NOTE — Assessment & Plan Note (Signed)
Chronic.  No alarm symptoms present at this time.  Goal blood pressure less than less than 130/80.  Refills have been provided today.  Labs today to check electrolytes and kidney function.  Recommend current treatment plan is effective, no change in therapy, orders and follow up as documented in EpicCare, reviewed diet, exercise and weight control, labs ordered and recent results reviewed with patient. Currently is followed with cardiology. We will make changes to plan of care as necessary based on lab results. Plan to follow-up in 6months.  

## 2023-03-30 ENCOUNTER — Encounter (HOSPITAL_BASED_OUTPATIENT_CLINIC_OR_DEPARTMENT_OTHER): Payer: 59 | Admitting: Nurse Practitioner

## 2023-07-06 ENCOUNTER — Ambulatory Visit: Payer: Medicaid Other | Admitting: Nurse Practitioner

## 2023-07-29 ENCOUNTER — Other Ambulatory Visit (HOSPITAL_BASED_OUTPATIENT_CLINIC_OR_DEPARTMENT_OTHER): Payer: Self-pay | Admitting: Nurse Practitioner

## 2023-07-29 DIAGNOSIS — I10 Essential (primary) hypertension: Secondary | ICD-10-CM

## 2023-08-20 ENCOUNTER — Encounter: Payer: Self-pay | Admitting: Nurse Practitioner

## 2023-08-20 ENCOUNTER — Ambulatory Visit: Payer: Medicaid Other | Admitting: Nurse Practitioner

## 2023-08-20 VITALS — BP 142/82 | HR 64 | Wt 167.2 lb

## 2023-08-20 DIAGNOSIS — I10 Essential (primary) hypertension: Secondary | ICD-10-CM

## 2023-08-20 DIAGNOSIS — R7303 Prediabetes: Secondary | ICD-10-CM | POA: Diagnosis not present

## 2023-08-20 DIAGNOSIS — Z1211 Encounter for screening for malignant neoplasm of colon: Secondary | ICD-10-CM

## 2023-08-20 DIAGNOSIS — E782 Mixed hyperlipidemia: Secondary | ICD-10-CM

## 2023-08-20 DIAGNOSIS — E559 Vitamin D deficiency, unspecified: Secondary | ICD-10-CM | POA: Diagnosis not present

## 2023-08-20 DIAGNOSIS — Z1231 Encounter for screening mammogram for malignant neoplasm of breast: Secondary | ICD-10-CM

## 2023-08-20 NOTE — Progress Notes (Signed)
Shawna Clamp, DNP, AGNP-c [facility]  [address] [place], [place] [address] [phone]  ESTABLISHED PATIENT- Chronic Health and/or Follow-Up Visit  Blood pressure (!) 142/82, pulse 64, weight 167 lb 3.2 oz (75.8 kg).    Maureen Drake is a 57 y.o. year old female presenting today for evaluation and management of pre-dm, htn, hld.  Ambria reports feeling good overall. She denies increased hunger, thirst, or urination. She has not had any new headaches, dizziness, or vision changes. She is monitoring her diet, but not particularly exercises regularly. She does help care for her grandchildren, which does keep her active. She is not checking blood sugars at home. At this time she denies any concerns.   All ROS negative with exception of what is listed above.   PHYSICAL EXAM Physical Exam Vitals and nursing note reviewed.  Constitutional:      General: She is not in acute distress.    Appearance: Normal appearance.  HENT:     Head: Normocephalic.  Eyes:     Conjunctiva/sclera: Conjunctivae normal.  Neck:     Vascular: No carotid bruit.  Cardiovascular:     Rate and Rhythm: Normal rate and regular rhythm.     Pulses: Normal pulses.     Heart sounds: Normal heart sounds. No murmur heard. Pulmonary:     Effort: Pulmonary effort is normal.     Breath sounds: Normal breath sounds.  Abdominal:     General: Bowel sounds are normal.     Palpations: Abdomen is soft.  Musculoskeletal:     Right lower leg: No edema.     Left lower leg: No edema.  Skin:    General: Skin is warm and dry.     Capillary Refill: Capillary refill takes less than 2 seconds.  Neurological:     General: No focal deficit present.     Mental Status: She is alert and oriented to person, place, and time.     Motor: No weakness.  Psychiatric:        Mood and Affect: Mood normal.     PLAN Problem List Items Addressed This Visit     Pre-diabetes - Primary    Chronic. Historically well  controlled. Currently managed with metformin and tolerating well. No symptoms present at this time. We will monitor labs today to determine if additional changes are needed. I recommend monitoring diet and walking at least 20 minutes a day at a brisk pace. Continue with current medication.       Relevant Orders   Hemoglobin A1c (Completed)   CBC with Differential/Platelet (Completed)   Comprehensive metabolic panel (Completed)   MM 3D SCREENING MAMMOGRAM BILATERAL BREAST   Vitamin D deficiency    Repeat labs today.       Relevant Orders   Hemoglobin A1c (Completed)   CBC with Differential/Platelet (Completed)   Comprehensive metabolic panel (Completed)   MM 3D SCREENING MAMMOGRAM BILATERAL BREAST   Mixed hyperlipidemia    Chronic. Lipids stable. No alarm symptoms present at this time. LDL Goal < 100. Controlled with  rosuvastatin .  Plan: Continue current lipid-lowering therapy. orders written for new lab studies as appropriate; see orders Recommend Treatment of hypertension  and Strict diet and exercise Diet and exercise recommendations provided.  Follow-up in 6months or sooner based on lab findings as appropriate.         Relevant Orders   Hemoglobin A1c (Completed)   CBC with Differential/Platelet (Completed)   Comprehensive metabolic panel (Completed)   MM  3D SCREENING MAMMOGRAM BILATERAL BREAST   Primary hypertension    Chronic. BP elevated today No alarm symptoms present at this time.  taking as prescribed. Goal blood pressure less than less than 130/80. Currently is not followed with cardiology.  Plan: current treatment plan is effective, no change in therapy, orders and follow up as documented in EpicCare.  Monitor BP at home and report if blood pressure remains higher than goal consistently.  Refills have been provided today. Labs today to check electrolytes and kidney function.  Will make changes to plan of care as necessary based on lab results.  Plan to follow-up  in 6months.       Relevant Orders   Hemoglobin A1c (Completed)   CBC with Differential/Platelet (Completed)   Comprehensive metabolic panel (Completed)   MM 3D SCREENING MAMMOGRAM BILATERAL BREAST   Other Visit Diagnoses     Screening mammogram for breast cancer       Screening for colon cancer       Relevant Orders   Cologuard       Return in about 6 months (around 02/20/2024) for Med Management 30.   Shawna Clamp, DNP, AGNP-c

## 2023-08-21 LAB — CBC WITH DIFFERENTIAL/PLATELET
Basophils Absolute: 0.1 10*3/uL (ref 0.0–0.2)
Basos: 1 %
EOS (ABSOLUTE): 0.4 10*3/uL (ref 0.0–0.4)
Eos: 6 %
Hematocrit: 43.2 % (ref 34.0–46.6)
Hemoglobin: 14.5 g/dL (ref 11.1–15.9)
Immature Grans (Abs): 0 10*3/uL (ref 0.0–0.1)
Immature Granulocytes: 0 %
Lymphocytes Absolute: 2.3 10*3/uL (ref 0.7–3.1)
Lymphs: 33 %
MCH: 30 pg (ref 26.6–33.0)
MCHC: 33.6 g/dL (ref 31.5–35.7)
MCV: 89 fL (ref 79–97)
Monocytes Absolute: 0.6 10*3/uL (ref 0.1–0.9)
Monocytes: 8 %
Neutrophils Absolute: 3.6 10*3/uL (ref 1.4–7.0)
Neutrophils: 52 %
Platelets: 227 10*3/uL (ref 150–450)
RBC: 4.84 x10E6/uL (ref 3.77–5.28)
RDW: 12.7 % (ref 11.7–15.4)
WBC: 7 10*3/uL (ref 3.4–10.8)

## 2023-08-21 LAB — COMPREHENSIVE METABOLIC PANEL
ALT: 25 IU/L (ref 0–32)
AST: 23 IU/L (ref 0–40)
Albumin: 4.4 g/dL (ref 3.8–4.9)
Alkaline Phosphatase: 83 IU/L (ref 44–121)
BUN/Creatinine Ratio: 22 (ref 9–23)
BUN: 14 mg/dL (ref 6–24)
Bilirubin Total: 0.5 mg/dL (ref 0.0–1.2)
CO2: 22 mmol/L (ref 20–29)
Calcium: 9.9 mg/dL (ref 8.7–10.2)
Chloride: 102 mmol/L (ref 96–106)
Creatinine, Ser: 0.63 mg/dL (ref 0.57–1.00)
Globulin, Total: 3 g/dL (ref 1.5–4.5)
Glucose: 92 mg/dL (ref 70–99)
Potassium: 4.2 mmol/L (ref 3.5–5.2)
Sodium: 140 mmol/L (ref 134–144)
Total Protein: 7.4 g/dL (ref 6.0–8.5)
eGFR: 103 mL/min/{1.73_m2} (ref >59)

## 2023-08-21 LAB — HEMOGLOBIN A1C
Est. average glucose Bld gHb Est-mCnc: 120 mg/dL
Hgb A1c MFr Bld: 5.8 % — ABNORMAL HIGH (ref 4.8–5.6)

## 2023-08-25 ENCOUNTER — Encounter: Payer: Self-pay | Admitting: Nurse Practitioner

## 2023-08-25 NOTE — Assessment & Plan Note (Signed)
Chronic. Lipids stable. No alarm symptoms present at this time. LDL Goal < 100. Controlled with  rosuvastatin .  Plan: Continue current lipid-lowering therapy. orders written for new lab studies as appropriate; see orders Recommend Treatment of hypertension  and Strict diet and exercise Diet and exercise recommendations provided.  Follow-up in 6months or sooner based on lab findings as appropriate.

## 2023-08-25 NOTE — Assessment & Plan Note (Signed)
Chronic. BP elevated today No alarm symptoms present at this time.  taking as prescribed. Goal blood pressure less than less than 130/80. Currently is not followed with cardiology.  Plan: current treatment plan is effective, no change in therapy, orders and follow up as documented in EpicCare.  Monitor BP at home and report if blood pressure remains higher than goal consistently.  Refills have been provided today. Labs today to check electrolytes and kidney function.  Will make changes to plan of care as necessary based on lab results.  Plan to follow-up in 6months.

## 2023-08-25 NOTE — Assessment & Plan Note (Signed)
Chronic. Historically well controlled. Currently managed with metformin and tolerating well. No symptoms present at this time. We will monitor labs today to determine if additional changes are needed. I recommend monitoring diet and walking at least 20 minutes a day at a brisk pace. Continue with current medication.

## 2023-08-25 NOTE — Assessment & Plan Note (Signed)
Repeat labs today

## 2023-12-27 ENCOUNTER — Ambulatory Visit: Payer: Medicaid Other | Admitting: Nurse Practitioner

## 2023-12-27 ENCOUNTER — Encounter: Payer: Self-pay | Admitting: Nurse Practitioner

## 2023-12-27 VITALS — BP 122/84 | HR 68 | Wt 164.6 lb

## 2023-12-27 DIAGNOSIS — L292 Pruritus vulvae: Secondary | ICD-10-CM

## 2023-12-27 DIAGNOSIS — Z1231 Encounter for screening mammogram for malignant neoplasm of breast: Secondary | ICD-10-CM

## 2023-12-27 DIAGNOSIS — F4323 Adjustment disorder with mixed anxiety and depressed mood: Secondary | ICD-10-CM | POA: Diagnosis not present

## 2023-12-27 DIAGNOSIS — R7303 Prediabetes: Secondary | ICD-10-CM

## 2023-12-27 DIAGNOSIS — E782 Mixed hyperlipidemia: Secondary | ICD-10-CM

## 2023-12-27 DIAGNOSIS — L989 Disorder of the skin and subcutaneous tissue, unspecified: Secondary | ICD-10-CM | POA: Diagnosis not present

## 2023-12-27 DIAGNOSIS — D229 Melanocytic nevi, unspecified: Secondary | ICD-10-CM

## 2023-12-27 DIAGNOSIS — I10 Essential (primary) hypertension: Secondary | ICD-10-CM

## 2023-12-27 MED ORDER — ROSUVASTATIN CALCIUM 10 MG PO TABS
10.0000 mg | ORAL_TABLET | Freq: Every day | ORAL | 3 refills | Status: AC
Start: 2023-12-27 — End: ?

## 2023-12-27 MED ORDER — AMLODIPINE BESYLATE 5 MG PO TABS: 5.0000 mg | ORAL_TABLET | Freq: Every day | ORAL | 1 refills | Status: DC

## 2023-12-27 MED ORDER — SERTRALINE HCL 50 MG PO TABS
50.0000 mg | ORAL_TABLET | Freq: Every day | ORAL | 0 refills | Status: DC
Start: 2023-12-27 — End: 2024-02-05

## 2023-12-27 MED ORDER — BETAMETHASONE DIPROPIONATE 0.05 % EX OINT
TOPICAL_OINTMENT | Freq: Two times a day (BID) | CUTANEOUS | 1 refills | Status: AC
Start: 2023-12-27 — End: ?

## 2023-12-27 MED ORDER — METFORMIN HCL 500 MG PO TABS: 500.0000 mg | ORAL_TABLET | Freq: Two times a day (BID) | ORAL | 3 refills | Status: AC

## 2023-12-27 MED ORDER — TRAZODONE HCL 50 MG PO TABS
50.0000 mg | ORAL_TABLET | Freq: Every evening | ORAL | 1 refills | Status: DC | PRN
Start: 2023-12-27 — End: 2024-02-05

## 2023-12-27 MED ORDER — METOPROLOL TARTRATE 25 MG PO TABS
25.0000 mg | ORAL_TABLET | Freq: Two times a day (BID) | ORAL | 1 refills | Status: DC
Start: 2023-12-27 — End: 2024-08-22

## 2023-12-27 NOTE — Progress Notes (Signed)
 Camie FORBES Doing, DNP, AGNP-c [facility] [address] [place], [place] [address] [phone]   ACUTE VISIT- ESTABLISHED PATIENT  Blood pressure 122/84, pulse 68, weight 164 lb 9.6 oz (74.7 kg).  Subjective:  HPI Maureen Drake is a 58 y.o. female presents to day for evaluation of acute concern(s).   History of Present Illness Maureen Drake, presents with a growing spot on the scalp, which is causing significant anxiety. The lesion, which appears as a dark stuck on mole, has been increasing in size over the past several years. The area measures 12mm x 10mm- right side of the scalp. She tells me that recently the area drained pus. The patient denies any pain on palpation. The patient's brother had a similar lesion, which was found benign on biopsy, but required subsequent excision, contributing to the patient's anxiety about the lesion. She does not wish for excision of the lesion unless absolutely necessary.   In addition to the scalp lesion, Maureen Drake has been experiencing episodes of anxiety and sadness, which she attributes to recent family issues.  These episodes are new and have been causing sleep disturbances and episodes of hand tremors. The patient expresses a preference for medication over counseling to manage these symptoms.  Her husband provides additional context, noting that the patient has been dealing with multiple losses and family issues in the past year, including the death of a best friend and a pet, as well as family disputes. These events have been causing significant distress for the patient. Despite these challenges, she expresses a determination to keep going.    ROS negative except for what is listed in HPI. History, Medications, Surgery, SDOH, and Family History reviewed and updated as appropriate.  Objective:  Physical Exam Vitals and nursing note reviewed.  Constitutional:      Appearance: Normal appearance.  HENT:     Head: Atraumatic.     Comments:  12mm x 10mm, dark, textured appearing, raise lesion on the right side of the scalp close to the face.  Eyes:     Conjunctiva/sclera: Conjunctivae normal.  Cardiovascular:     Rate and Rhythm: Normal rate and regular rhythm.     Pulses: Normal pulses.     Heart sounds: Normal heart sounds.  Pulmonary:     Effort: Pulmonary effort is normal.     Breath sounds: Normal breath sounds.  Musculoskeletal:     Right lower leg: No edema.     Left lower leg: No edema.  Skin:    General: Skin is warm and dry.     Capillary Refill: Capillary refill takes less than 2 seconds.  Neurological:     General: No focal deficit present.     Mental Status: She is alert and oriented to person, place, and time.     Motor: No weakness.          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 Prefers medication over counseling. Discussed sertraline  for anxiety and depression, and trazodone  for sleep. - Prescribe sertraline , starting with half a pill at bedtime for the first week, then increasing to a full pill - Consider trazodone  for sleep if sertraline  alone is insufficient -  Follow up in 3-4 weeks to assess effectiveness of sertraline       Relevant Medications   sertraline  (ZOLOFT ) 50 MG tablet   traZODone  (DESYREL ) 50 MG tablet   Lesion of skin of scalp   Lesion on the scalp, 12mm by 10mm. Dark in color with no variation. No signs of infection, drainage, or tenderness. Differential diagnosis includes seborrheic keratosis vs. actinic keratosis. Patient hesitant about surgical excision due to a family member's experience. Given the size, location, and coloration of  the lesion, I do recommend treatment for removal.  - Prescribe topical Imiquimod  cream to reduce lesion size - Consider surgical excision if no improvement with topical treatment      Pre-diabetes   Relevant Medications   metFORMIN  (GLUCOPHAGE ) 500 MG tablet   Other Relevant Orders   CBC with Differential/Platelet (Completed)   Hemoglobin A1c (Completed)   Comprehensive metabolic panel (Completed)   Mixed hyperlipidemia   Relevant Medications   rosuvastatin  (CRESTOR ) 10 MG tablet   metoprolol  tartrate (LOPRESSOR ) 25 MG tablet   amLODipine  (NORVASC ) 5 MG tablet   Other Relevant Orders   CBC with Differential/Platelet (Completed)   Hemoglobin A1c (Completed)   Comprehensive metabolic panel (Completed)   Lipid panel (Completed)   Primary hypertension   Relevant Medications   rosuvastatin  (CRESTOR ) 10 MG tablet   metoprolol  tartrate (LOPRESSOR ) 25 MG tablet   amLODipine  (NORVASC ) 5 MG tablet   Other Relevant Orders   CBC with Differential/Platelet (Completed)   Hemoglobin A1c (Completed)   Comprehensive metabolic panel (Completed)   Other Visit Diagnoses       Screening mammogram for breast cancer       Relevant Orders   MM 3D SCREENING MAMMOGRAM BILATERAL BREAST         Camie FORBES Doing, DNP, AGNP-c

## 2023-12-27 NOTE — Patient Instructions (Addendum)
 I will send in a cream for you to put on the mole on the side of the scalp. We will try this for a few months and see if this helps.   I have sent in sertraline  for you to try for your mood. You can start this with 1/2 tab a day and then move up to 1 tab a day after 7 days. You should notice that you are feeling better in about 2 weeks. If you are not feeling better after 3 weeks please let me know.  I have sent trazodone  to help with your sleep. You can take this as needed or every night and that is ok. If this doesn't help with your sleep then I want you to let me know.   I sent in refills of all of your other medications.   I want to talk with you in about 6 weeks to make sure you feel better. We can do a visit on your phone.

## 2023-12-28 ENCOUNTER — Encounter: Payer: Self-pay | Admitting: Nurse Practitioner

## 2023-12-28 DIAGNOSIS — L570 Actinic keratosis: Secondary | ICD-10-CM

## 2023-12-28 LAB — CBC WITH DIFFERENTIAL/PLATELET
Basophils Absolute: 0.1 10*3/uL (ref 0.0–0.2)
Basos: 1 %
EOS (ABSOLUTE): 0.3 10*3/uL (ref 0.0–0.4)
Eos: 6 %
Hematocrit: 44.5 % (ref 34.0–46.6)
Hemoglobin: 15 g/dL (ref 11.1–15.9)
Immature Grans (Abs): 0 10*3/uL (ref 0.0–0.1)
Immature Granulocytes: 0 %
Lymphocytes Absolute: 2 10*3/uL (ref 0.7–3.1)
Lymphs: 37 %
MCH: 30.2 pg (ref 26.6–33.0)
MCHC: 33.7 g/dL (ref 31.5–35.7)
MCV: 90 fL (ref 79–97)
Monocytes Absolute: 0.4 10*3/uL (ref 0.1–0.9)
Monocytes: 7 %
Neutrophils Absolute: 2.7 10*3/uL (ref 1.4–7.0)
Neutrophils: 49 %
Platelets: 228 10*3/uL (ref 150–450)
RBC: 4.96 x10E6/uL (ref 3.77–5.28)
RDW: 12.5 % (ref 11.7–15.4)
WBC: 5.5 10*3/uL (ref 3.4–10.8)

## 2023-12-28 LAB — COMPREHENSIVE METABOLIC PANEL
ALT: 19 [IU]/L (ref 0–32)
AST: 16 [IU]/L (ref 0–40)
Albumin: 4.5 g/dL (ref 3.8–4.9)
Alkaline Phosphatase: 78 [IU]/L (ref 44–121)
BUN/Creatinine Ratio: 16 (ref 9–23)
BUN: 8 mg/dL (ref 6–24)
Bilirubin Total: 0.3 mg/dL (ref 0.0–1.2)
CO2: 23 mmol/L (ref 20–29)
Calcium: 9.3 mg/dL (ref 8.7–10.2)
Chloride: 105 mmol/L (ref 96–106)
Creatinine, Ser: 0.51 mg/dL — ABNORMAL LOW (ref 0.57–1.00)
Globulin, Total: 3 g/dL (ref 1.5–4.5)
Glucose: 94 mg/dL (ref 70–99)
Potassium: 4.3 mmol/L (ref 3.5–5.2)
Sodium: 142 mmol/L (ref 134–144)
Total Protein: 7.5 g/dL (ref 6.0–8.5)
eGFR: 109 mL/min/{1.73_m2} (ref >59)

## 2023-12-28 LAB — LIPID PANEL
Chol/HDL Ratio: 3.3 {ratio} (ref 0.0–4.4)
Cholesterol, Total: 169 mg/dL (ref 100–199)
HDL: 51 mg/dL (ref >39)
LDL Chol Calc (NIH): 93 mg/dL (ref 0–99)
Triglycerides: 143 mg/dL (ref 0–149)
VLDL Cholesterol Cal: 25 mg/dL (ref 5–40)

## 2023-12-28 LAB — HEMOGLOBIN A1C
Est. average glucose Bld gHb Est-mCnc: 126 mg/dL
Hgb A1c MFr Bld: 6 % — ABNORMAL HIGH (ref 4.8–5.6)

## 2024-01-01 ENCOUNTER — Encounter: Payer: Self-pay | Admitting: Nurse Practitioner

## 2024-01-01 DIAGNOSIS — L989 Disorder of the skin and subcutaneous tissue, unspecified: Secondary | ICD-10-CM | POA: Insufficient documentation

## 2024-01-01 DIAGNOSIS — F4323 Adjustment disorder with mixed anxiety and depressed mood: Secondary | ICD-10-CM | POA: Insufficient documentation

## 2024-01-01 MED ORDER — IMIQUIMOD 3.75 % EX CREA: TOPICAL_CREAM | CUTANEOUS | 0 refills | Status: DC

## 2024-01-01 NOTE — Assessment & Plan Note (Signed)
Refill betamethasone.

## 2024-01-01 NOTE — Assessment & Plan Note (Signed)
 Lesion on the scalp, 12mm by 10mm. Dark in color with no variation. No signs of infection, drainage, or tenderness. Differential diagnosis includes seborrheic keratosis vs. actinic keratosis. Patient hesitant about surgical excision due to a family member's experience. Given the size, location, and coloration of the lesion, I do recommend treatment for removal.  - Prescribe topical Imiquimod  cream to reduce lesion size - Consider surgical excision if no improvement with topical treatment

## 2024-01-01 NOTE — Assessment & Plan Note (Signed)
 Increased anxiety and sadness, difficulty sleeping, low energy levels, exacerbated by recent family stressors and personal losses. Prefers medication over counseling. Discussed sertraline  for anxiety and depression, and trazodone  for sleep. - Prescribe sertraline , starting with half a pill at bedtime for the first week, then increasing to a full pill - Consider trazodone  for sleep if sertraline  alone is insufficient - Follow up in 3-4 weeks to assess effectiveness of sertraline 

## 2024-01-08 ENCOUNTER — Encounter: Payer: Self-pay | Admitting: Nurse Practitioner

## 2024-01-08 DIAGNOSIS — L989 Disorder of the skin and subcutaneous tissue, unspecified: Secondary | ICD-10-CM

## 2024-01-08 MED ORDER — IMIQUIMOD 5 % EX CREA: TOPICAL_CREAM | CUTANEOUS | 6 refills | Status: DC

## 2024-01-09 ENCOUNTER — Other Ambulatory Visit (HOSPITAL_COMMUNITY): Payer: Self-pay

## 2024-01-09 ENCOUNTER — Telehealth: Payer: Self-pay

## 2024-01-09 NOTE — Telephone Encounter (Addendum)
Pharmacy Patient Advocate Encounter  Insurance verification completed.   The patient is insured through [facility]   Ran test claim for Imiquimod 5%. Currently a quantity of 12each is a 30 day supply and the co-pay is 4.00 . NO P/A NEEDED.  However I have contact the pharmacy and was informed they medication is not in stock and has been ordered and will arrive on Friday. (The pharmacy states they have informed the patient)  This test claim was processed through [facility]- copay amounts may vary at other pharmacies due to pharmacy/plan contracts, or as the patient moves through the different stages of their insurance plan.    (Please see previosus encounter as this medication was inquired about)

## 2024-01-11 ENCOUNTER — Other Ambulatory Visit: Payer: Self-pay

## 2024-01-11 ENCOUNTER — Other Ambulatory Visit (HOSPITAL_BASED_OUTPATIENT_CLINIC_OR_DEPARTMENT_OTHER): Payer: Self-pay

## 2024-01-11 DIAGNOSIS — L989 Disorder of the skin and subcutaneous tissue, unspecified: Secondary | ICD-10-CM

## 2024-01-11 MED ORDER — IMIQUIMOD 5 % EX CREA
TOPICAL_CREAM | CUTANEOUS | 6 refills | Status: AC
  Filled 2024-01-11: qty 12, 28d supply, fill #0

## 2024-01-14 ENCOUNTER — Other Ambulatory Visit (HOSPITAL_BASED_OUTPATIENT_CLINIC_OR_DEPARTMENT_OTHER): Payer: Self-pay

## 2024-02-05 ENCOUNTER — Telehealth (INDEPENDENT_AMBULATORY_CARE_PROVIDER_SITE_OTHER): Payer: Medicaid Other | Admitting: Nurse Practitioner

## 2024-02-05 ENCOUNTER — Encounter: Payer: Self-pay | Admitting: Nurse Practitioner

## 2024-02-05 DIAGNOSIS — L989 Disorder of the skin and subcutaneous tissue, unspecified: Secondary | ICD-10-CM

## 2024-02-05 DIAGNOSIS — F4323 Adjustment disorder with mixed anxiety and depressed mood: Secondary | ICD-10-CM | POA: Diagnosis not present

## 2024-02-05 DIAGNOSIS — F5104 Psychophysiologic insomnia: Secondary | ICD-10-CM | POA: Diagnosis not present

## 2024-02-05 MED ORDER — SERTRALINE HCL 50 MG PO TABS: 50.0000 mg | ORAL_TABLET | Freq: Every day | ORAL | 3 refills | Status: AC

## 2024-02-05 MED ORDER — TRAZODONE HCL 50 MG PO TABS: 50.0000 mg | ORAL_TABLET | Freq: Every evening | ORAL | 3 refills | Status: AC | PRN

## 2024-02-05 NOTE — Assessment & Plan Note (Signed)
Improvement in size and appearance of the lesion since starting imiquimod for treatment. Recommend continuing the application as prescribed until the lesion is resolved. We will monitor the area at her follow-up.

## 2024-02-05 NOTE — Assessment & Plan Note (Signed)
Improvement in anxiety and depressive symptoms with PHQ-9 and GAD7 results near zero today. She is currently on sertraline 50mg  and tolerating well. Given the improvement of symptoms we will plan to continue on the current dose. She is aware to let me know if she would like to try to taper off the medication, although, I do recommend staying on for at least 6 months for treatment.  - Continue sertraline 50mg   - Follow-up at next medication check in 4-6 months or sooner if changes occur.

## 2024-02-05 NOTE — Assessment & Plan Note (Signed)
Resolution of insomnia with improvement of mood and start of trazodone. She is currently taking 50mg  nightly and reports excellent sleep of at least 8-10 hours and waking feeling refreshed. She would like to continue on the medication at this time.  - Continue on trazodone 50mg  as needed for sleep.

## 2024-02-05 NOTE — Progress Notes (Signed)
Virtual Visit Encounter mychart visit.   I connected with  Aracelia Molock on 02/05/24 at  9:15 AM EST by secure video and audio telemedicine application. I verified that I am speaking with the correct person using two identifiers.   I introduced myself as a Publishing rights manager with the practice. The limitations of evaluation and management by telemedicine discussed with the patient and the availability of in person appointments. The patient expressed verbal understanding and consent to proceed.  Participating parties in this visit include: Myself, patient, and husband  The patient is: Patient Location: Home I am: Provider Location: Office/Clinic Subjective:    CC and HPI: Maureen Drake is a 58 y.o. year old female presenting for follow up of mood. Anxiety and depression identified at last visit. Started on sertraline and trazodone.   Adrena and Lelis (her husband) tell me today that she is doing much better since the start sertraline. She reports she is sleeping through the night and waking feeling refreshed. She also no longer has days where she is tearful and sad and lacking motivation. She reports no known side effects from the medication and feels that the current dose is effective. Lelis reports he has noticed a significant improvement and is pleased at how well she has been doing.   Calley also tells me that since starting the imiquimod the lesion on her scalp has become flat and decreased in size significantly. She is not having any side effects of the medication.   Past medical history, Surgical history, Family history not pertinant except as noted below, Social history, Allergies, and medications have been entered into the medical record, reviewed, and corrections made.   Review of Systems:  All review of systems negative except what is listed in the HPI  Objective:    Alert and oriented x 4 Speaking in clear sentences with no shortness of breath. No distress.  Impression and  Recommendations:    Problem List Items Addressed This Visit     Psychophysiological insomnia - Primary   Resolution of insomnia with improvement of mood and start of trazodone. She is currently taking 50mg  nightly and reports excellent sleep of at least 8-10 hours and waking feeling refreshed. She would like to continue on the medication at this time.  - Continue on trazodone 50mg  as needed for sleep.       Situational mixed anxiety and depressive disorder   Improvement in anxiety and depressive symptoms with PHQ-9 and GAD7 results near zero today. She is currently on sertraline 50mg  and tolerating well. Given the improvement of symptoms we will plan to continue on the current dose. She is aware to let me know if she would like to try to taper off the medication, although, I do recommend staying on for at least 6 months for treatment.  - Continue sertraline 50mg   - Follow-up at next medication check in 4-6 months or sooner if changes occur.       Relevant Medications   sertraline (ZOLOFT) 50 MG tablet   traZODone (DESYREL) 50 MG tablet   Lesion of skin of scalp   Improvement in size and appearance of the lesion since starting imiquimod for treatment. Recommend continuing the application as prescribed until the lesion is resolved. We will monitor the area at her follow-up.        current treatment plan is effective, no change in therapy I discussed the assessment and treatment plan with the patient. The patient was provided an opportunity to ask questions and all  were answered. The patient agreed with the plan and demonstrated an understanding of the instructions.   The patient was advised to call back or seek an in-person evaluation if the symptoms worsen or if the condition fails to improve as anticipated.  Follow-Up: 4-6 months for routine follow-up  I provided 15 minutes of non-face-to-face interaction with this non face-to-face encounter including intake, same-day documentation,  and chart review.   Tollie Eth, NP , DNP, AGNP-c  [facility]

## 2024-03-24 ENCOUNTER — Telehealth: Payer: Self-pay

## 2024-03-24 ENCOUNTER — Other Ambulatory Visit (HOSPITAL_COMMUNITY): Payer: Self-pay

## 2024-03-24 NOTE — Telephone Encounter (Signed)
 Pharmacy Patient Advocate Encounter  Insurance verification completed.   The patient is insured through [facility]   Ran test claim for Sertraline HCl 50MG  tablets. Currently a quantity of 90 is a 90 day supply and the co-pay is 4.00 . (No P/A is needed. The filling pharmacy had to put in DUR CODES  This test claim was processed through [facility]- copay amounts may vary at other pharmacies due to pharmacy/plan contracts, or as the patient moves through the different stages of their insurance plan.

## 2024-08-13 ENCOUNTER — Other Ambulatory Visit: Payer: Self-pay | Admitting: Nurse Practitioner

## 2024-08-13 DIAGNOSIS — I10 Essential (primary) hypertension: Secondary | ICD-10-CM

## 2024-08-22 ENCOUNTER — Other Ambulatory Visit: Payer: Self-pay | Admitting: Nurse Practitioner

## 2024-08-22 DIAGNOSIS — I10 Essential (primary) hypertension: Secondary | ICD-10-CM

## 2024-09-04 ENCOUNTER — Other Ambulatory Visit: Payer: Self-pay | Admitting: Nurse Practitioner

## 2024-09-04 DIAGNOSIS — E782 Mixed hyperlipidemia: Secondary | ICD-10-CM

## 2024-09-04 DIAGNOSIS — E559 Vitamin D deficiency, unspecified: Secondary | ICD-10-CM

## 2024-09-04 DIAGNOSIS — I10 Essential (primary) hypertension: Secondary | ICD-10-CM

## 2024-09-04 DIAGNOSIS — R7303 Prediabetes: Secondary | ICD-10-CM

## 2024-09-04 DIAGNOSIS — G8929 Other chronic pain: Secondary | ICD-10-CM

## 2024-09-04 NOTE — Telephone Encounter (Signed)
 Does the pt. Need to remain on this.
